# Patient Record
Sex: Female | Born: 1935 | Race: White | Hispanic: No | Smoking: Former smoker
Health system: Southern US, Community
[De-identification: ages and names within clinical notes are randomized; demographics above are authoritative.]

## PROBLEM LIST (undated history)

## (undated) DIAGNOSIS — I10 Essential (primary) hypertension: Secondary | ICD-10-CM

## (undated) DIAGNOSIS — E119 Type 2 diabetes mellitus without complications: Secondary | ICD-10-CM

## (undated) DIAGNOSIS — E079 Disorder of thyroid, unspecified: Secondary | ICD-10-CM

## (undated) DIAGNOSIS — M1990 Unspecified osteoarthritis, unspecified site: Secondary | ICD-10-CM

## (undated) DIAGNOSIS — G459 Transient cerebral ischemic attack, unspecified: Secondary | ICD-10-CM

## (undated) HISTORY — PX: EYE SURGERY: SHX253

---

## 2017-08-12 ENCOUNTER — Emergency Department
Admission: EM | Admit: 2017-08-12 | Discharge: 2017-08-12 | Disposition: A | Discharge status: Home / Self Care | Payer: Medicare Other | Attending: Emergency Medicine | Admitting: Emergency Medicine

## 2017-08-12 DIAGNOSIS — R410 Disorientation, unspecified: Secondary | ICD-10-CM | POA: Diagnosis present

## 2017-08-12 DIAGNOSIS — Z8673 Personal history of transient ischemic attack (TIA), and cerebral infarction without residual deficits: Secondary | ICD-10-CM | POA: Diagnosis not present

## 2017-08-12 DIAGNOSIS — I10 Essential (primary) hypertension: Secondary | ICD-10-CM | POA: Diagnosis not present

## 2017-08-12 LAB — COMPREHENSIVE METABOLIC PANEL
ALT: 12 U/L (ref 0–44)
AST: 17 U/L (ref 15–41)
Albumin: 3.6 g/dL (ref 3.5–5.0)
Alkaline Phosphatase: 95 U/L (ref 38–126)
Anion gap: 8 (ref 5–15)
BUN: 23 mg/dL (ref 8–23)
CHLORIDE: 102 mmol/L (ref 98–111)
CO2: 26 mmol/L (ref 22–32)
Calcium: 9 mg/dL (ref 8.9–10.3)
Creatinine, Ser: 1.03 mg/dL — ABNORMAL HIGH (ref 0.44–1.00)
GFR calc Af Amer: 54 mL/min — ABNORMAL LOW (ref >60)
GFR, EST NON AFRICAN AMERICAN: 47 mL/min — AB (ref >60)
Glucose, Bld: 104 mg/dL — ABNORMAL HIGH (ref 70–99)
POTASSIUM: 4.9 mmol/L (ref 3.5–5.1)
Sodium: 136 mmol/L (ref 135–145)
Total Bilirubin: 0.4 mg/dL (ref 0.3–1.2)
Total Protein: 7.5 g/dL (ref 6.5–8.1)

## 2017-08-12 LAB — URINALYSIS, ROUTINE W REFLEX MICROSCOPIC
Bilirubin Urine: NEGATIVE
GLUCOSE, UA: NEGATIVE mg/dL
Hgb urine dipstick: NEGATIVE
Ketones, ur: NEGATIVE mg/dL
LEUKOCYTES UA: NEGATIVE
Nitrite: NEGATIVE
PH: 6 (ref 5.0–8.0)
Protein, ur: NEGATIVE mg/dL
SPECIFIC GRAVITY, URINE: 1.016 (ref 1.005–1.030)

## 2017-08-12 LAB — CBC
HEMATOCRIT: 29.7 % — AB (ref 35.0–47.0)
Hemoglobin: 9.9 g/dL — ABNORMAL LOW (ref 12.0–16.0)
MCH: 27.9 pg (ref 26.0–34.0)
MCHC: 33.1 g/dL (ref 32.0–36.0)
MCV: 84.1 fL (ref 80.0–100.0)
Platelets: 316 10*3/uL (ref 150–440)
RBC: 3.53 MIL/uL — AB (ref 3.80–5.20)
RDW: 15.1 % — ABNORMAL HIGH (ref 11.5–14.5)
WBC: 8.4 10*3/uL (ref 3.6–11.0)

## 2017-08-12 NOTE — ED Notes (Signed)
Warm blankets provided. Family at bedside. Pt alert. Blood pressure cuff placed on arm, pt on cardiac monitor.

## 2017-08-12 NOTE — ED Notes (Signed)
Family requesting to speak with md. md notified.

## 2017-08-12 NOTE — ED Provider Notes (Signed)
[facility] Emergency Department Provider Note  Time seen: 7:55 PM  I have reviewed the triage vital signs and the nursing notes.   HISTORY  Chief Complaint Altered Mental Status    HPI Jessica Morse is a 84 y.o. female with a past medical history of hypertension, hyperlipidemia, possible seizure disorder now on Keppra, prior TIA on clopidogrel, presents to the emergency department for confusion.  According to the daughters for the past 1 week the patient has been intermittently confused.  They noticed that the patient was supposed to be taking tramadol as needed but for the past at least 1 week the patient has been taking it twice daily on a scheduled basis.  They state they discontinued use of this medication today, last night was her last dose.  They stated that the patient appeared to be better today was not as confused however this afternoon she once again became confused so they sent her to the emergency department for evaluation from her nursing facility.  Here the patient is awake alert she is oriented x4 and she has no complaints.  Daughters are here with the patient who states the patient is now back to her baseline.  State the patient has been seen by neurology they initially but the patient on Keppra 500 mg twice daily but decrease this to 250 mg due to sedation.  No obvious seizure or at least tonic-clonic seizure today per daughter's per report.  No infectious symptoms per daughter.  Largely negative review of systems.   History reviewed. No pertinent past medical history.  There are no active problems to display for this patient.   History reviewed. No pertinent surgical history.  Prior to Admission medications   Not on File    Allergies  Allergen Reactions  . Clonidine Derivatives   . Codeine   . Iodinated Diagnostic Agents   . Propoxyphene     History reviewed. No pertinent family history.  Social History Social History   Tobacco Use   . Smoking status: Never Smoker  . Smokeless tobacco: Never Used  Substance Use Topics  . Alcohol use: Never    Frequency: Never  . Drug use: Never    Review of Systems Constitutional: Negative for fever. Cardiovascular: Negative for chest pain. Respiratory: Negative for shortness of breath. Gastrointestinal: Negative for abdominal pain, vomiting and diarrhea. Genitourinary: Negative for urinary compaints Musculoskeletal: Negative for musculoskeletal complaints Skin: Negative for skin complaints  Neurological: Negative for headache All other ROS negative  ____________________________________________   PHYSICAL EXAM:  VITAL SIGNS: ED Triage Vitals  Enc Vitals Group     BP --      Pulse Rate 08/12/17 1915 66     Resp 08/12/17 1915 18     Temp --      Temp src --      SpO2 08/12/17 1915 100 %     Weight 08/12/17 1817 180 lb (81.6 kg)     Height 08/12/17 1817 5\' 5"  (1.651 m)     Head Circumference --      Peak Flow --      Pain Score 08/12/17 1816 0     Pain Loc --      Pain Edu? --      Excl. in GC? --    Constitutional: Alert and oriented. Well appearing and in no distress. Eyes: Normal exam ENT   Head: Normocephalic and atraumatic.   Mouth/Throat: Mucous membranes are moist. Cardiovascular: Normal rate, regular rhythm Respiratory: Normal  respiratory effort without tachypnea nor retractions. Breath sounds are clear  Gastrointestinal: Soft and nontender. No distention. Musculoskeletal: Nontender with normal range of motion in all extremities.  Neurologic:  Normal speech and language. No gross focal neurologic deficits  Skin:  Skin is warm, dry and intact.  Psychiatric: Mood and affect are normal.   ____________________________________________    EKG  EKG reviewed and interpreted by myself shows what appears to be a sinus rhythm at 68 bpm with a narrow QRS, normal axis, normal intervals nonspecific ST changes.  Electrical interference.   INITIAL  IMPRESSION / ASSESSMENT AND PLAN / ED COURSE  Pertinent labs & imaging results that were available during my care of the patient were reviewed by me and considered in my medical decision making (see chart for details).  Patient presents to the emergency department for altered mental status, now resolved.  Differential would include medication reaction, TIA, partial or absence seizure, infectious etiology, metabolic abnormality.  Overall the patient appears very well she is currently alert and oriented x4 she is hard of hearing but is otherwise normal physical examination.  Patient's labs show mild anemia otherwise negative.  Urinalysis is normal.  I discussed with the patient's daughters were now here with the patient he states the patient has been taking tramadol and they believe this could be the cause of her intermittent altered mental status, this very possibly could be the cause.  They state the patient has seen [facility] neurology.  Has a [facility] primary care physician.  Has good outpatient follow-up.  As the patient appears very well currently with normal work-up they are comfortable taking the patient home and following up with her primary care doctor.  I believe this to be a reasonable and rational plan of care given the patient's current presentation.  ____________________________________________   FINAL CLINICAL IMPRESSION(S) / ED DIAGNOSES  Confusion, resolved    Minna AntisPaduchowski, Brandonn Capelli, MD 08/12/17 [phone]

## 2017-08-12 NOTE — ED Triage Notes (Signed)
[facility] c/o AMS. Pt is oriented x4. Protocol initiated.

## 2017-09-16 ENCOUNTER — Emergency Department: Payer: Medicare Other

## 2017-09-16 ENCOUNTER — Emergency Department
Admission: EM | Admit: 2017-09-16 | Discharge: 2017-09-16 | Disposition: A | Discharge status: Skilled Nursing Facility | Payer: Medicare Other | Attending: Emergency Medicine | Admitting: Emergency Medicine

## 2017-09-16 DIAGNOSIS — I10 Essential (primary) hypertension: Secondary | ICD-10-CM | POA: Diagnosis not present

## 2017-09-16 DIAGNOSIS — E119 Type 2 diabetes mellitus without complications: Secondary | ICD-10-CM | POA: Diagnosis not present

## 2017-09-16 DIAGNOSIS — W1789XA Other fall from one level to another, initial encounter: Secondary | ICD-10-CM | POA: Insufficient documentation

## 2017-09-16 DIAGNOSIS — S01112A Laceration without foreign body of left eyelid and periocular area, initial encounter: Secondary | ICD-10-CM | POA: Diagnosis not present

## 2017-09-16 DIAGNOSIS — S0990XA Unspecified injury of head, initial encounter: Secondary | ICD-10-CM

## 2017-09-16 DIAGNOSIS — Y939 Activity, unspecified: Secondary | ICD-10-CM | POA: Diagnosis not present

## 2017-09-16 DIAGNOSIS — Y999 Unspecified external cause status: Secondary | ICD-10-CM | POA: Insufficient documentation

## 2017-09-16 DIAGNOSIS — W19XXXA Unspecified fall, initial encounter: Secondary | ICD-10-CM

## 2017-09-16 DIAGNOSIS — Y92129 Unspecified place in nursing home as the place of occurrence of the external cause: Secondary | ICD-10-CM | POA: Insufficient documentation

## 2017-09-16 NOTE — ED Notes (Signed)
EDP at bedside cleaned laceration and placed derma bound on the laceration. VSS

## 2017-09-16 NOTE — ED Triage Notes (Signed)
Pt presents today [facility] for [facility]. Pt [facility] and [facility] off the edge. Pt hit head on table leg and got a small lac to the Left eyebrow. EDP at bedside.

## 2017-09-16 NOTE — ED Provider Notes (Signed)
[facility] Emergency Department Provider Note  Time seen: 12:46 PM  I have reviewed the triage vital signs and the nursing notes.   HISTORY  Chief Complaint Fall    HPI Jessica Morse is a 84 y.o. female with a past medical history of macular degeneration, diabetes, hypertension, presents to the emergency department after a fall.  According to EMS report patient was at her nursing facility when she fell to the ground hitting her head on the floor.  Patient also landed on her left hand.  Patient has small laceration to her left eyebrow, hemostatic upon EMS arrival.  Patient complaining of pain to the left hand.  Denies any headache.  Is not sure if she lost consciousness.  No past medical history on file.  There are no active problems to display for this patient.   No past surgical history on file.  Prior to Admission medications   Not on File    Allergies  Allergen Reactions  . Clonidine Derivatives   . Codeine   . Iodinated Diagnostic Agents   . Propoxyphene     No family history on file.  Social History Social History   Tobacco Use  . Smoking status: Never Smoker  . Smokeless tobacco: Never Used  Substance Use Topics  . Alcohol use: Never    Frequency: Never  . Drug use: Never    Review of Systems Constitutional: Unknown LOC. Eyes: Negative for visual complaints, recent cataract surgery. Cardiovascular: Negative for chest pain. Respiratory: Negative for shortness of breath. Gastrointestinal: Negative for abdominal pain Musculoskeletal: Mild to moderate left hand pain Skin: Small laceration left eyebrow, hemostatic. Neurological: Negative for headache All other ROS negative  ____________________________________________   PHYSICAL EXAM:  VITAL SIGNS: ED Triage Vitals [09/16/17 1245]  Enc Vitals Group     BP      Pulse      Resp      Temp      Temp src      SpO2      Weight 171 lb (77.6 kg)     Height 5\' 6"  (1.676 m)      Head Circumference      Peak Flow      Pain Score 0     Pain Loc      Pain Edu?      Excl. in GC?    Constitutional: Alert and oriented. Well appearing and in no distress. Eyes: Normal exam ENT   Head: Small hematoma overlying left eyebrow with very small laceration approximately 1.5 cm, hemostatic and non-gaping.   Mouth/Throat: Mucous membranes are moist. Cardiovascular: Normal rate, regular rhythm.  2/6 systolic ejection murmur. Respiratory: Normal respiratory effort without tachypnea nor retractions. Breath sounds are clear Gastrointestinal: Soft and nontender. No distention. Musculoskeletal: Mild ecchymosis to dorsal aspect of left hand with mild to moderate tenderness to palpation over this area. Neurologic:  Normal speech and language. No gross focal neurologic deficits  Skin:  Skin is warm, dry and intact.  Psychiatric: Mood and affect are normal.   ____________________________________________   RADIOLOGY  CT scan of the head and neck are negative. Left hand x-ray negative  ____________________________________________   INITIAL IMPRESSION / ASSESSMENT AND PLAN / ED COURSE  Pertinent labs & imaging results that were available during my care of the patient were reviewed by me and considered in my medical decision making (see chart for details).  Patient presents emergency department for a fall.  Patient had a small laceration left  eyebrow, hemostatic which was cleaned by myself and Dermabond applied to ensure hemostasis continues.  Will obtain CT imaging of the head to help rule out ICH, will obtain a left hand x-ray to rule out fracture.  Imaging is negative.  Patient continues to appear very well in the emergency department.  We will discharge.  Family here with patient and are both agreeable to plan of care.  ____________________________________________   FINAL CLINICAL IMPRESSION(S) / ED DIAGNOSES  Fall Laceration    Minna AntisPaduchowski, Rosendo Couser,  MD 09/16/17 1436

## 2017-09-16 NOTE — ED Notes (Signed)
Pt was given drink, crackers and PB at this time. Family is at bedside. Pt is awaiting CT at this time.

## 2017-10-05 ENCOUNTER — Other Ambulatory Visit: Payer: Self-pay | Admitting: Radiology

## 2019-04-09 IMAGING — CT CT CERVICAL SPINE W/O CM
3 of 5 series · 14 of 33 positions shown, 16 images · non-contrast
Comparison: None.

CLINICAL DATA: Fell and hit head today.

EXAM:
CT HEAD WITHOUT CONTRAST
CT CERVICAL SPINE WITHOUT CONTRAST
TECHNIQUE: Multidetector CT imaging of the head and cervical spine was
performed following the standard protocol without intravenous
contrast. Multiplanar CT image reconstructions of the cervical spine
were also generated.

[Series 5: c spine soft · axial · 0.26mm/px · z∈[-200,-102]mm · 6 of 64 slices shown, 8 images]
[im 8/64  soft-tissue]
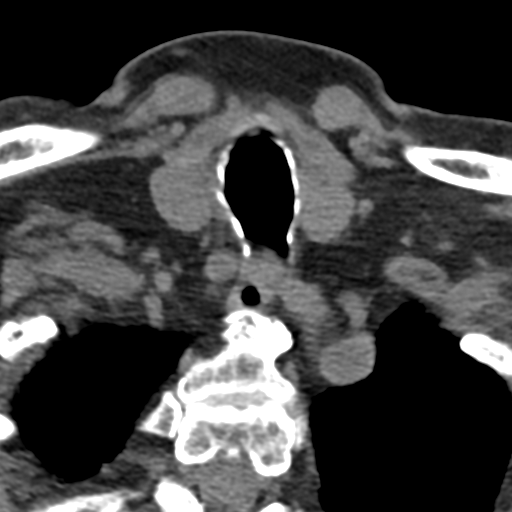
[im 8/64  bone]
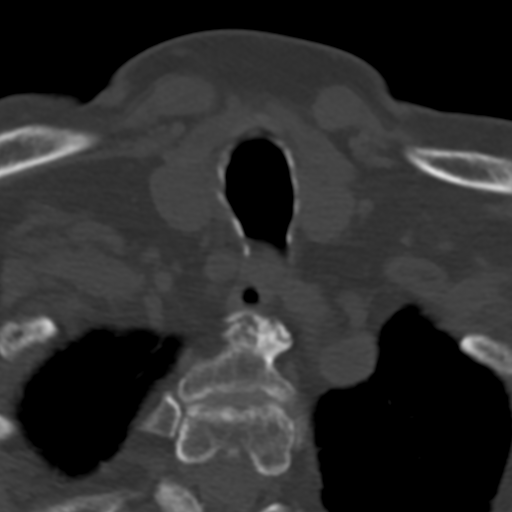
[im 22/64  bone]
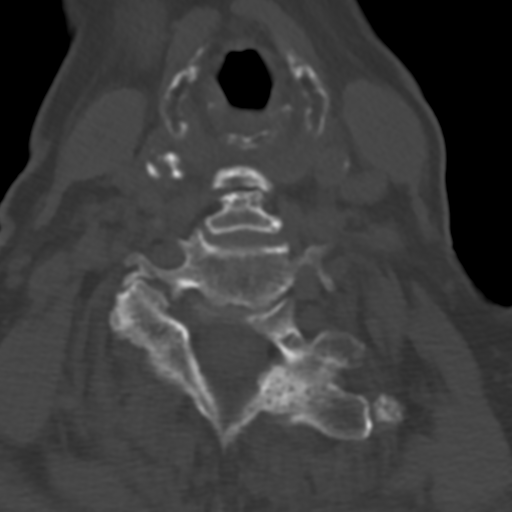
[im 29/64  bone]
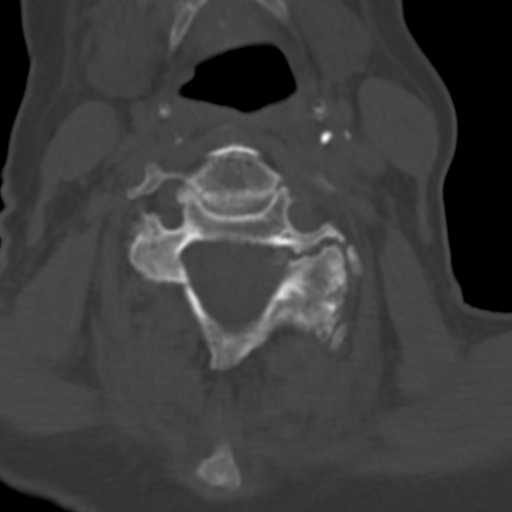
[im 36/64  bone]
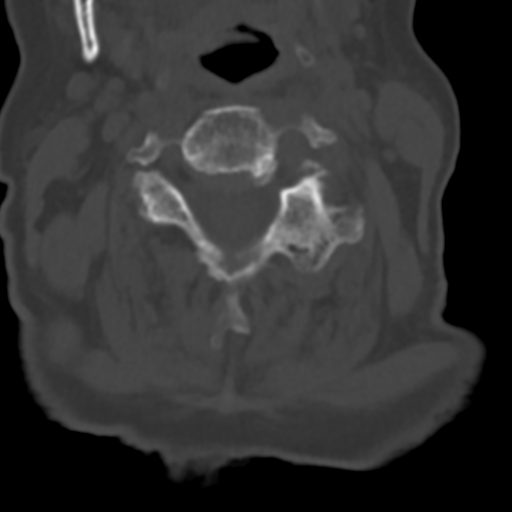
[im 50/64  soft-tissue]
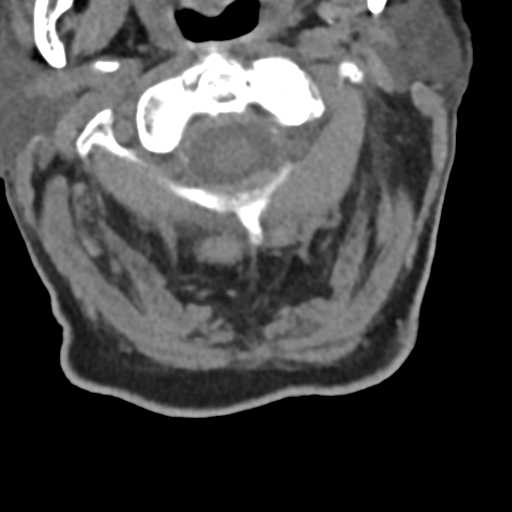
[im 50/64  bone]
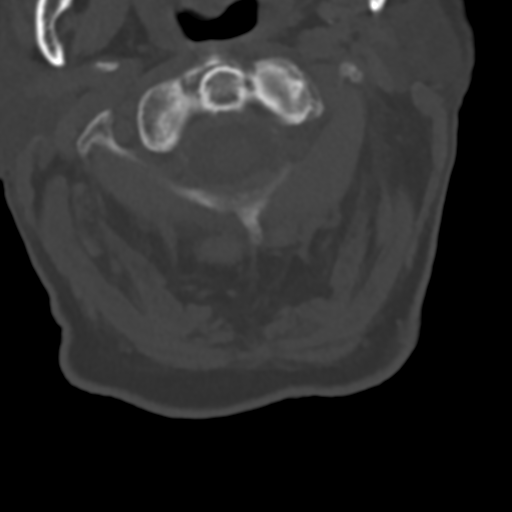
[im 57/64  bone]
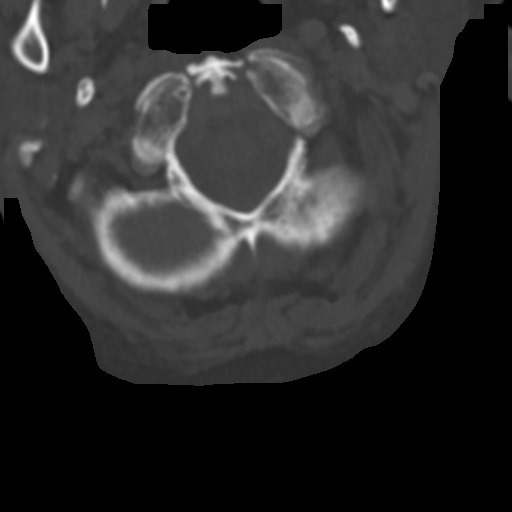

[Series 8: coronal soft tissue · coronal · 0.30mm/px · 3 of 61 slices shown]
[im 22/61  bone]
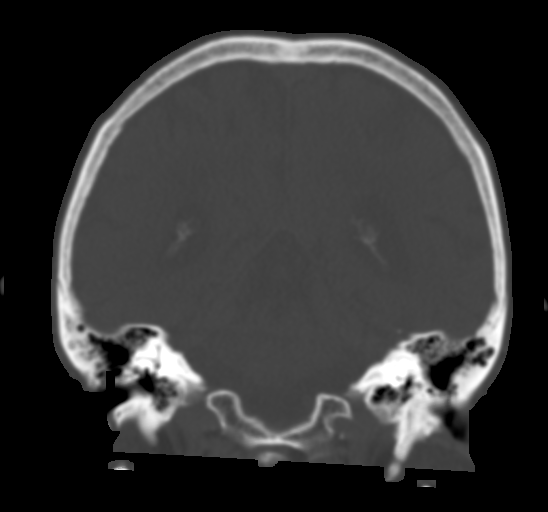
[im 28/61  bone]
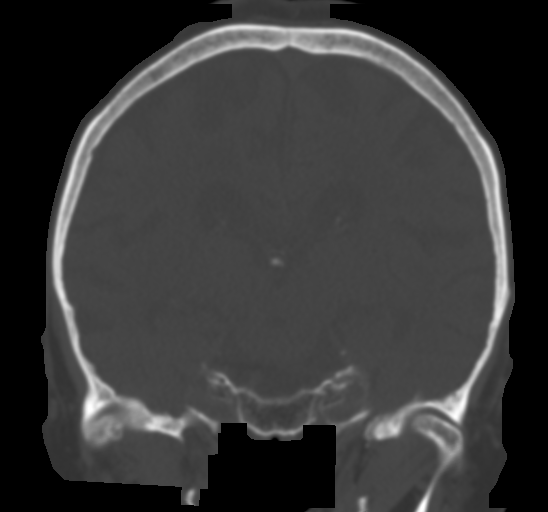
[im 33/61  bone]
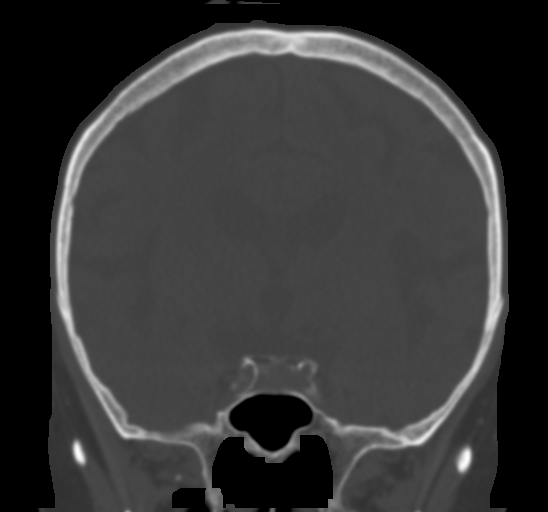

[Series 10: sagittal bone · sagittal · 0.23mm/px · 5 of 58 slices shown]
[im 10/58  bone]
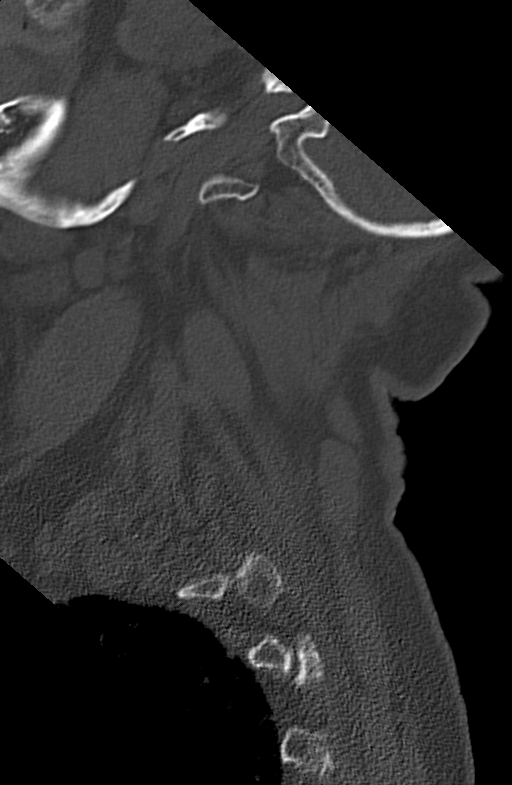
[im 20/58  bone]
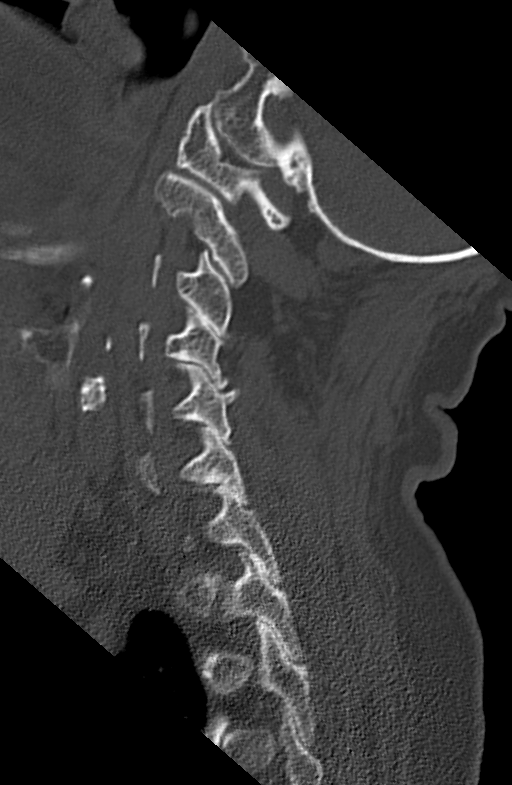
[im 29/58  bone]
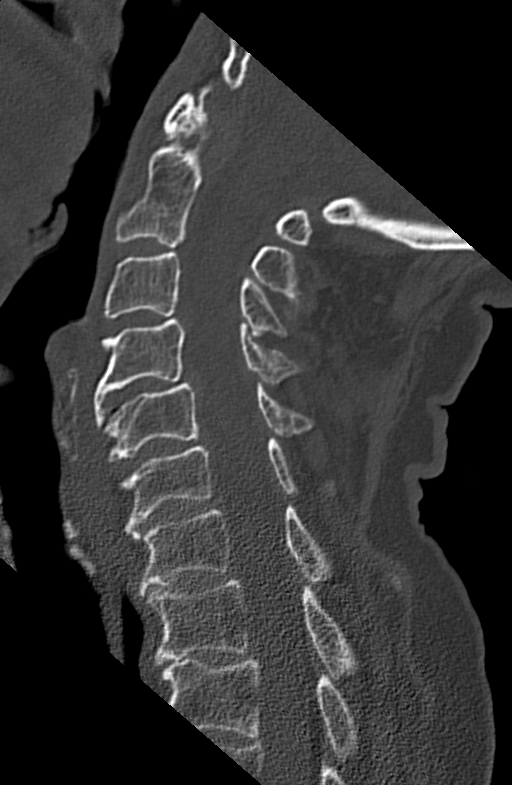
[im 39/58  bone]
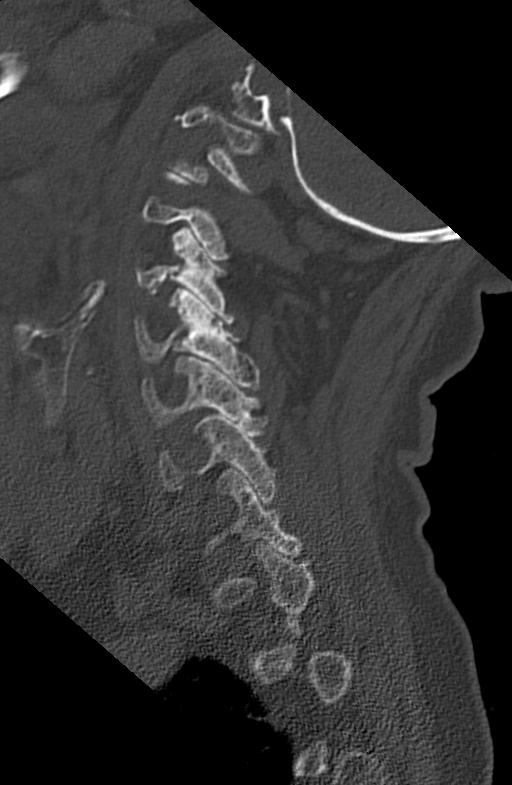
[im 48/58  bone]
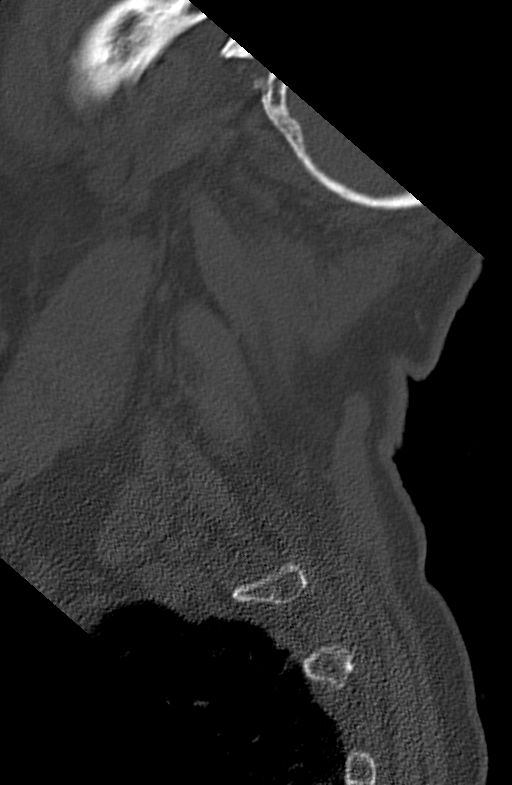

[14 of 33 positions shown; findings below may reference images not displayed]

FINDINGS: CT HEAD FINDINGS

Brain: Age related cerebral atrophy, ventriculomegaly and
periventricular white matter disease. No extra-axial fluid
collections are identified. No CT findings for acute hemispheric
infarction or intracranial hemorrhage. No mass lesions. There is a
small peripheral lesion in the left parietal area which contains fat
and is likely small lipoma or dermoid.

Vascular: No hyperdense vessel or unexpected calcification.
Atherosclerotic calcifications are noted.

Skull: No acute skull fractures identified.  No bone lesions.

Sinuses/Orbits: The paranasal sinuses and mastoid air cells are
clear. The globes are intact.

Other: There is a subcutaneous hematoma involving the left orbital
and supraorbital region.

CT CERVICAL SPINE FINDINGS

Alignment: Degenerative cervical spondylosis with multilevel disc
disease and facet disease.

Skull base and vertebrae: No acute fracture. No primary bone lesion
or focal pathologic process.

Soft tissues and spinal canal: No prevertebral fluid or swelling. No
visible canal hematoma.

Disc levels: Very generous spinal canal. No canal stenosis or large
disc protrusions. There is multilevel foraminal stenosis due to
severe facet disease and uncinate spurring.

Upper chest: The lung apices are grossly clear.

Other: No neck mass or adenopathy. Bilateral carotid artery
calcifications noted.
IMPRESSION: 1. No acute intracranial findings or acute skull fracture.
2. Left orbital and supraorbital scalp hematoma without underlying
fracture.
3. Degenerative cervical spondylosis with multilevel disc disease
and facet disease but no acute cervical spine fracture or canal
compromise.

## 2021-03-31 ENCOUNTER — Non-Acute Institutional Stay: Payer: Medicare Other | Admitting: Student

## 2021-03-31 DIAGNOSIS — R600 Localized edema: Secondary | ICD-10-CM

## 2021-03-31 DIAGNOSIS — F320 Major depressive disorder, single episode, mild: Secondary | ICD-10-CM

## 2021-03-31 DIAGNOSIS — Z515 Encounter for palliative care: Secondary | ICD-10-CM

## 2021-03-31 DIAGNOSIS — F0280 Dementia in other diseases classified elsewhere without behavioral disturbance: Secondary | ICD-10-CM

## 2021-03-31 NOTE — Progress Notes (Signed)
? ? ?[facility] ?Community Palliative Care Consult Note ?Telephone: [phone]  ?Fax: [phone]  ? ?Date of encounter: 03/31/21 ?12:50 PM ?PATIENT NAME: Jessica Morse ?[place] [address]   ?[phone] (home)  ?DOB: 03-04-1926 ?MRN: [id number] ?PRIMARY CARE PROVIDER:    ?Dr. Peggyann Juba  ? ?REFERRING PROVIDER:   ?Dr. Peggyann Juba  ? ?RESPONSIBLE PARTY:    ?Contact Information   ? ? Name Relation Home Work Mobile  ? Jessica Morse Daughter [phone]    ? ?  ? ? ? ?I met face to face with patient in the facility. Palliative Care was asked to follow this patient by consultation request of  Dr. Peggyann Juba to address advance care planning and complex medical decision making. This is the initial visit.  ? ?Spoke with daughter Jessica Morse via telephone.  ? ? ?                                 ASSESSMENT AND PLAN / RECOMMENDATIONS:  ? ?Advance Care Planning/Goals of Care: Goals include to maximize quality of life and symptom management. Patient/health care surrogate gave his/her permission to discuss.Our advance care planning conversation included a discussion about:    ?The value and importance of advance care planning  ?Experiences with loved ones who have been seriously ill or have died  ?Exploration of personal, cultural or spiritual beliefs that might influence medical decisions  ?Exploration of goals of care in the event of a sudden injury or illness  ?CODE STATUS: DNR ? ?Family would like for patient to be comfortable.  Education provided on Palliative Medicine vs. Hospice services. Will continue to provide supportive care, symptom management. Will refer for hospice when she meets criteria.  ? ? ?Symptom Management/Plan: ? ?Mixed Alzheimer's and vascular dementia- continue to reorient and direct as needed. Staff to assist with adl's. Monitor for adjustment to new facility, monitor for worsening behaviors, hallucinations or delusions. Use walker for ambulation; monitor for  falls/safety. Continue trazodone as directed.  ? ?Depression-continue citalopram 20 mg daily; monitor for worsening depression/adjustment to facility.  ? ?LE edema-continue compression hose daily; elevate legs when in bed or chair.  ? ?Follow up Palliative Care Visit: Palliative care will continue to follow for complex medical decision making, advance care planning, and clarification of goals. Return in 4 weeks or prn. ? ? ?This visit was coded based on medical decision making (MDM). ? ?PPS: 40% ? ?HOSPICE ELIGIBILITY/DIAGNOSIS: TBD ? ?Chief Complaint: Palliative Medicine initial consult.  ? ?HISTORY OF PRESENT ILLNESS:  Jessica Morse is a 88 y.o. year old female  with mixed alzheimer's and vascular dementia, anxiety, depression, TIA, tremor, T2DM, seizure disorder, hx of chronic osteomyelitis on chronic antibiotic suppression.   ? ?Patient recently moved to [place]. She is on dementia unit. She is adjusting per staff, but does become tearful when her family visits. She is able to be redirected. Staff is attempting to engage her more, bring out to common area. She is eating well. Denies pain, shortness of breath, nausea, constipation. Uses walker for ambulation; no recent falls. Hx of right lower extremity DVT; her Eliquis was stopped during most recent hospitalization.  ? ?Patient received in her room; she came out to common area. She does answer questions; she is hard of hearing. Pleasant and cooperative. HPI and ROS primarily obtained per facility staff and daughter.  ? ?History obtained from review of EMR, discussion  with primary team, and interview with family, facility staff/caregiver and/or Jessica Morse.  ?I reviewed available labs, medications, imaging, studies and related documents from the EMR.  Records reviewed and summarized above.  ? ? ?Physical Exam: ?Pulse 72, resp 16, sats 99% on room air ?Constitutional: NAD ?General: frail appearing, ?EYES: anicteric sclera, lids intact, no  discharge  ?ENMT: hard of hearing, oral mucous membranes moist ?CV: S1S2, RRR, 1+ LE edema ?Pulmonary: LCTA, no increased work of breathing, no cough, room air ?Abdomen:  normo-active BS + 4 quadrants, soft and non tender, no ascites ?GU: deferred ?MSK: no sarcopenia, moves all extremities, ambulatory ?Skin: warm and dry, no rashes or wounds on visible skin ?Neuro: + generalized weakness,  A & O to person ?Psych: non-anxious affect ?Hem/lymph/immuno: no widespread bruising ?CURRENT PROBLEM LIST: There are no problems to display for this patient. ? ?PAST MEDICAL HISTORY:  ?Active Ambulatory Problems  ?  Diagnosis Date Noted  ? No Active Ambulatory Problems  ? ?Resolved Ambulatory Problems  ?  Diagnosis Date Noted  ? No Resolved Ambulatory Problems  ? ?No Additional Past Medical History  ? ?SOCIAL HX:  ?Social History  ? ?Tobacco Use  ? Smoking status: Never  ? Smokeless tobacco: Never  ?Substance Use Topics  ? Alcohol use: Never  ? ?FAMILY HX: No family history on file.   ? ?ALLERGIES:  ?Allergies  ?Allergen Reactions  ? Quinolones Anaphylaxis  ?  Family history of reaction  ? Clonazepam Other (See Comments)  ? Clonidine Derivatives   ? Codeine   ? Iodinated Contrast Media   ? Propoxyphene   ?   ?PERTINENT MEDICATIONS:  ?Outpatient Encounter Medications as of 03/31/2021  ?Medication Sig  ? Artificial Saliva (BIOTENE DRY MOUTH MOISTURIZING) SOLN Take 1 spray by mouth 3 (three) times daily before meals.  ? cephALEXin (KEFLEX) 500 MG capsule Take 500 mg by mouth 2 (two) times daily.  ? citalopram (CELEXA) 20 MG tablet Take 20 mg by mouth daily.  ? glipiZIDE (GLUCOTROL) 5 MG tablet Take 2.5 mg by mouth every morning.  ? levETIRAcetam (KEPPRA) 250 MG tablet Take 250-500 mg by mouth 2 (two) times daily. Take 250 by mouth in the morning and 500 mg by mouth at bedtime.  ? levothyroxine (SYNTHROID, LEVOTHROID) 137 MCG tablet Take 137 mcg by mouth daily before breakfast.  ? lisinopril (PRINIVIL,ZESTRIL) 20 MG tablet Take 20 mg  by mouth daily.  ? Melatonin 3 MG TABS Take 3 mg by mouth at bedtime.  ? metFORMIN (GLUCOPHAGE) 1000 MG tablet Take 1,000 mg by mouth at bedtime.  ? multivitamin-lutein (OCUVITE-LUTEIN) CAPS capsule Take 1 capsule by mouth daily.  ? prednisoLONE acetate (PRED FORTE) 1 % ophthalmic suspension Place 1 drop into the right eye 4 (four) times daily.  ? propranolol (INDERAL) 20 MG tablet Take 20 mg by mouth 2 (two) times daily.  ? ?No facility-administered encounter medications on file as of 03/31/2021.  ? ?Thank you for the opportunity to participate in the care of Ms. Dlugosz.  The palliative care team will continue to follow. Please call our office at [phone] if we can be of additional assistance.  ? ?Ezekiel Slocumb, NP  ? ?COVID-19 PATIENT SCREENING TOOL ?Asked and negative response unless otherwise noted: ? ?Have you had symptoms of covid, tested positive or been in contact with someone with symptoms/positive test in the past 5-10 days? No ? ?

## 2021-04-22 ENCOUNTER — Emergency Department: Payer: Medicare Other

## 2021-04-22 ENCOUNTER — Encounter: Payer: Self-pay | Admitting: Intensive Care

## 2021-04-22 ENCOUNTER — Inpatient Hospital Stay
Admission: EM | Admit: 2021-04-22 | Discharge: 2021-04-27 | DRG: 871 | Disposition: A | Discharge status: Hospice | Payer: Medicare Other | Source: Skilled Nursing Facility | Attending: Internal Medicine | Admitting: Internal Medicine

## 2021-04-22 ENCOUNTER — Other Ambulatory Visit: Payer: Self-pay

## 2021-04-22 DIAGNOSIS — E119 Type 2 diabetes mellitus without complications: Secondary | ICD-10-CM | POA: Diagnosis present

## 2021-04-22 DIAGNOSIS — R627 Adult failure to thrive: Secondary | ICD-10-CM | POA: Diagnosis present

## 2021-04-22 DIAGNOSIS — F419 Anxiety disorder, unspecified: Secondary | ICD-10-CM | POA: Diagnosis present

## 2021-04-22 DIAGNOSIS — A419 Sepsis, unspecified organism: Principal | ICD-10-CM | POA: Diagnosis present

## 2021-04-22 DIAGNOSIS — G40909 Epilepsy, unspecified, not intractable, without status epilepticus: Secondary | ICD-10-CM | POA: Diagnosis present

## 2021-04-22 DIAGNOSIS — R54 Age-related physical debility: Secondary | ICD-10-CM | POA: Diagnosis present

## 2021-04-22 DIAGNOSIS — Z7984 Long term (current) use of oral hypoglycemic drugs: Secondary | ICD-10-CM

## 2021-04-22 DIAGNOSIS — Z87898 Personal history of other specified conditions: Secondary | ICD-10-CM

## 2021-04-22 DIAGNOSIS — G4700 Insomnia, unspecified: Secondary | ICD-10-CM | POA: Diagnosis present

## 2021-04-22 DIAGNOSIS — F0390 Unspecified dementia without behavioral disturbance: Secondary | ICD-10-CM

## 2021-04-22 DIAGNOSIS — X58XXXA Exposure to other specified factors, initial encounter: Secondary | ICD-10-CM | POA: Diagnosis present

## 2021-04-22 DIAGNOSIS — J189 Pneumonia, unspecified organism: Secondary | ICD-10-CM | POA: Diagnosis not present

## 2021-04-22 DIAGNOSIS — Z79890 Hormone replacement therapy (postmenopausal): Secondary | ICD-10-CM

## 2021-04-22 DIAGNOSIS — Z20822 Contact with and (suspected) exposure to covid-19: Secondary | ICD-10-CM | POA: Diagnosis present

## 2021-04-22 DIAGNOSIS — Z66 Do not resuscitate: Secondary | ICD-10-CM | POA: Diagnosis present

## 2021-04-22 DIAGNOSIS — K449 Diaphragmatic hernia without obstruction or gangrene: Secondary | ICD-10-CM | POA: Diagnosis present

## 2021-04-22 DIAGNOSIS — I10 Essential (primary) hypertension: Secondary | ICD-10-CM | POA: Diagnosis present

## 2021-04-22 DIAGNOSIS — F03911 Unspecified dementia, unspecified severity, with agitation: Secondary | ICD-10-CM | POA: Diagnosis present

## 2021-04-22 DIAGNOSIS — Z91041 Radiographic dye allergy status: Secondary | ICD-10-CM

## 2021-04-22 DIAGNOSIS — Z87891 Personal history of nicotine dependence: Secondary | ICD-10-CM

## 2021-04-22 DIAGNOSIS — Z881 Allergy status to other antibiotic agents status: Secondary | ICD-10-CM

## 2021-04-22 DIAGNOSIS — Z515 Encounter for palliative care: Secondary | ICD-10-CM

## 2021-04-22 DIAGNOSIS — S42202A Unspecified fracture of upper end of left humerus, initial encounter for closed fracture: Secondary | ICD-10-CM | POA: Diagnosis present

## 2021-04-22 DIAGNOSIS — Z885 Allergy status to narcotic agent status: Secondary | ICD-10-CM

## 2021-04-22 DIAGNOSIS — Z888 Allergy status to other drugs, medicaments and biological substances status: Secondary | ICD-10-CM

## 2021-04-22 DIAGNOSIS — R531 Weakness: Secondary | ICD-10-CM

## 2021-04-22 DIAGNOSIS — F0392 Unspecified dementia, unspecified severity, with psychotic disturbance: Secondary | ICD-10-CM | POA: Diagnosis present

## 2021-04-22 DIAGNOSIS — I700 Atherosclerosis of aorta: Secondary | ICD-10-CM | POA: Diagnosis present

## 2021-04-22 DIAGNOSIS — F0394 Unspecified dementia, unspecified severity, with anxiety: Secondary | ICD-10-CM | POA: Diagnosis present

## 2021-04-22 DIAGNOSIS — E039 Hypothyroidism, unspecified: Secondary | ICD-10-CM | POA: Diagnosis present

## 2021-04-22 DIAGNOSIS — I2510 Atherosclerotic heart disease of native coronary artery without angina pectoris: Secondary | ICD-10-CM | POA: Diagnosis present

## 2021-04-22 DIAGNOSIS — G9341 Metabolic encephalopathy: Secondary | ICD-10-CM | POA: Diagnosis present

## 2021-04-22 DIAGNOSIS — Z8673 Personal history of transient ischemic attack (TIA), and cerebral infarction without residual deficits: Secondary | ICD-10-CM

## 2021-04-22 DIAGNOSIS — M1990 Unspecified osteoarthritis, unspecified site: Secondary | ICD-10-CM | POA: Diagnosis present

## 2021-04-22 DIAGNOSIS — F0393 Unspecified dementia, unspecified severity, with mood disturbance: Secondary | ICD-10-CM | POA: Diagnosis present

## 2021-04-22 DIAGNOSIS — Z79899 Other long term (current) drug therapy: Secondary | ICD-10-CM

## 2021-04-22 HISTORY — DX: Unspecified osteoarthritis, unspecified site: M19.90

## 2021-04-22 HISTORY — DX: Transient cerebral ischemic attack, unspecified: G45.9

## 2021-04-22 HISTORY — DX: Disorder of thyroid, unspecified: E07.9

## 2021-04-22 HISTORY — DX: Essential (primary) hypertension: I10

## 2021-04-22 HISTORY — DX: Type 2 diabetes mellitus without complications: E11.9

## 2021-04-22 LAB — CBC
HCT: 36.2 % (ref 36.0–46.0)
Hemoglobin: 11.8 g/dL — ABNORMAL LOW (ref 12.0–15.0)
MCH: 30.2 pg (ref 26.0–34.0)
MCHC: 32.6 g/dL (ref 30.0–36.0)
MCV: 92.6 fL (ref 80.0–100.0)
Platelets: 200 10*3/uL (ref 150–400)
RBC: 3.91 MIL/uL (ref 3.87–5.11)
RDW: 12.2 % (ref 11.5–15.5)
WBC: 12.7 10*3/uL — ABNORMAL HIGH (ref 4.0–10.5)
nRBC: 0 % (ref 0.0–0.2)

## 2021-04-22 LAB — BASIC METABOLIC PANEL
Anion gap: 9 (ref 5–15)
BUN: 20 mg/dL (ref 8–23)
CO2: 27 mmol/L (ref 22–32)
Calcium: 8.7 mg/dL — ABNORMAL LOW (ref 8.9–10.3)
Chloride: 98 mmol/L (ref 98–111)
Creatinine, Ser: 1.1 mg/dL — ABNORMAL HIGH (ref 0.44–1.00)
GFR, Estimated: 47 mL/min — ABNORMAL LOW (ref >60)
Glucose, Bld: 244 mg/dL — ABNORMAL HIGH (ref 70–99)
Potassium: 4 mmol/L (ref 3.5–5.1)
Sodium: 134 mmol/L — ABNORMAL LOW (ref 135–145)

## 2021-04-22 LAB — URINALYSIS, ROUTINE W REFLEX MICROSCOPIC
Bacteria, UA: NONE SEEN
Bilirubin Urine: NEGATIVE
Glucose, UA: 150 mg/dL — AB
Hgb urine dipstick: NEGATIVE
Ketones, ur: 5 mg/dL — AB
Leukocytes,Ua: NEGATIVE
Nitrite: NEGATIVE
Protein, ur: 30 mg/dL — AB
Specific Gravity, Urine: 1.018 (ref 1.005–1.030)
pH: 6 (ref 5.0–8.0)

## 2021-04-22 LAB — RESP PANEL BY RT-PCR (FLU A&B, COVID) ARPGX2
Influenza A by PCR: NEGATIVE
Influenza B by PCR: NEGATIVE
SARS Coronavirus 2 by RT PCR: NEGATIVE

## 2021-04-22 LAB — LACTIC ACID, PLASMA: Lactic Acid, Venous: 1.3 mmol/L (ref 0.5–1.9)

## 2021-04-22 MED ORDER — INSULIN ASPART 100 UNIT/ML IJ SOLN
0.0000 [IU] | Freq: Three times a day (TID) | INTRAMUSCULAR | Status: DC
  Administered 2021-04-23: 3 [IU] via SUBCUTANEOUS
  Administered 2021-04-23: 2 [IU] via SUBCUTANEOUS
  Filled 2021-04-22 (×3): qty 1

## 2021-04-22 MED ORDER — SODIUM CHLORIDE 0.9 % IV BOLUS (SEPSIS)
500.0000 mL | Freq: Once | INTRAVENOUS | Status: AC
  Administered 2021-04-22: 500 mL via INTRAVENOUS

## 2021-04-22 MED ORDER — POLYETHYLENE GLYCOL 3350 17 G PO PACK: 17.0000 g | PACK | Freq: Every day | ORAL | Status: DC | PRN

## 2021-04-22 MED ORDER — SODIUM CHLORIDE 0.9 % IV SOLN
500.0000 mg | INTRAVENOUS | Status: DC
  Administered 2021-04-23: 500 mg via INTRAVENOUS
  Filled 2021-04-22 (×2): qty 5

## 2021-04-22 MED ORDER — METFORMIN HCL 500 MG PO TABS
1000.0000 mg | ORAL_TABLET | Freq: Every day | ORAL | Status: DC
  Administered 2021-04-23: 1000 mg via ORAL
  Filled 2021-04-22: qty 2

## 2021-04-22 MED ORDER — PROPRANOLOL HCL 20 MG PO TABS
20.0000 mg | ORAL_TABLET | Freq: Two times a day (BID) | ORAL | Status: DC
  Administered 2021-04-23 (×2): 20 mg via ORAL
  Filled 2021-04-22 (×3): qty 1

## 2021-04-22 MED ORDER — SODIUM CHLORIDE 0.9 % IV BOLUS
1000.0000 mL | Freq: Once | INTRAVENOUS | Status: AC
  Administered 2021-04-22: 1000 mL via INTRAVENOUS

## 2021-04-22 MED ORDER — AZITHROMYCIN 500 MG IV SOLR
500.0000 mg | Freq: Once | INTRAVENOUS | Status: AC
  Administered 2021-04-22: 500 mg via INTRAVENOUS
  Filled 2021-04-22: qty 5

## 2021-04-22 MED ORDER — LEVOTHYROXINE SODIUM 137 MCG PO TABS
137.0000 ug | ORAL_TABLET | Freq: Every day | ORAL | Status: DC
  Filled 2021-04-22: qty 1

## 2021-04-22 MED ORDER — ONDANSETRON HCL 4 MG PO TABS: 4.0000 mg | ORAL_TABLET | Freq: Four times a day (QID) | ORAL | Status: DC | PRN

## 2021-04-22 MED ORDER — MELATONIN 5 MG PO TABS
2.5000 mg | ORAL_TABLET | Freq: Every evening | ORAL | Status: DC | PRN
  Administered 2021-04-26: 2.5 mg via ORAL
  Filled 2021-04-22: qty 1

## 2021-04-22 MED ORDER — CITALOPRAM HYDROBROMIDE 20 MG PO TABS
20.0000 mg | ORAL_TABLET | Freq: Every day | ORAL | Status: DC
  Administered 2021-04-23: 20 mg via ORAL
  Filled 2021-04-22 (×2): qty 1

## 2021-04-22 MED ORDER — HYDROCOD POLI-CHLORPHE POLI ER 10-8 MG/5ML PO SUER
5.0000 mL | Freq: Every evening | ORAL | Status: DC | PRN
  Administered 2021-04-23: 5 mL via ORAL
  Filled 2021-04-22: qty 5

## 2021-04-22 MED ORDER — ACETAMINOPHEN 500 MG PO TABS
1000.0000 mg | ORAL_TABLET | Freq: Once | ORAL | Status: DC
Start: 2021-04-22 — End: 2021-04-22
  Filled 2021-04-22: qty 2

## 2021-04-22 MED ORDER — LISINOPRIL 10 MG PO TABS: 20.0000 mg | ORAL_TABLET | Freq: Every day | ORAL | Status: DC

## 2021-04-22 MED ORDER — ENOXAPARIN SODIUM 40 MG/0.4ML IJ SOSY
40.0000 mg | PREFILLED_SYRINGE | INTRAMUSCULAR | Status: DC
  Administered 2021-04-23: 40 mg via SUBCUTANEOUS
  Filled 2021-04-22 (×2): qty 0.4

## 2021-04-22 MED ORDER — SODIUM CHLORIDE 0.9 % IV SOLN
2.0000 g | Freq: Once | INTRAVENOUS | Status: AC
  Administered 2021-04-22: 2 g via INTRAVENOUS
  Filled 2021-04-22 (×2): qty 20

## 2021-04-22 MED ORDER — SODIUM CHLORIDE 0.9 % IV SOLN
2.0000 g | INTRAVENOUS | Status: DC
  Administered 2021-04-23: 2 g via INTRAVENOUS
  Filled 2021-04-22 (×2): qty 20

## 2021-04-22 MED ORDER — ACETAMINOPHEN 325 MG PO TABS: 650.0000 mg | ORAL_TABLET | Freq: Four times a day (QID) | ORAL | Status: DC | PRN

## 2021-04-22 MED ORDER — INSULIN ASPART 100 UNIT/ML IJ SOLN: 0.0000 [IU] | Freq: Every day | INTRAMUSCULAR | Status: DC

## 2021-04-22 MED ORDER — DM-GUAIFENESIN ER 30-600 MG PO TB12
1.0000 | ORAL_TABLET | Freq: Two times a day (BID) | ORAL | Status: DC | PRN
  Administered 2021-04-24: 1 via ORAL
  Filled 2021-04-22: qty 1

## 2021-04-22 MED ORDER — ACETAMINOPHEN 10 MG/ML IV SOLN
1000.0000 mg | Freq: Four times a day (QID) | INTRAVENOUS | Status: DC
  Administered 2021-04-22 – 2021-04-23 (×3): 1000 mg via INTRAVENOUS
  Filled 2021-04-22 (×4): qty 100

## 2021-04-22 MED ORDER — ACETAMINOPHEN 650 MG RE SUPP: 650.0000 mg | Freq: Four times a day (QID) | RECTAL | Status: DC | PRN

## 2021-04-22 MED ORDER — ONDANSETRON HCL 4 MG/2ML IJ SOLN: 4.0000 mg | Freq: Four times a day (QID) | INTRAMUSCULAR | Status: DC | PRN

## 2021-04-22 NOTE — Assessment & Plan Note (Addendum)
-   Home citalopram 20 mg daily ?- Trazodone 25 mg BID, resumed for night of admission ?

## 2021-04-22 NOTE — ED Triage Notes (Addendum)
[facility] for [facility]MS and weakness. Daughter reports legs have been weaker this week and today patient cannot ambulate. Staff reported they were unable to get patient up today. Patient hard of hearing. Daughter also reports breathing is more heavy today. Patient also has cough present. Daughter reports patient had blood clot in right leg recently and her right foot noted to be swollen ?

## 2021-04-22 NOTE — Assessment & Plan Note (Addendum)
-   Chronic for many years and confirmed with daughter, Britta Mccreedy at bedside ?

## 2021-04-22 NOTE — Assessment & Plan Note (Addendum)
-   Patient takes  propanolol 20 mg p.o. twice daily as home medications, this has been resumed ?

## 2021-04-22 NOTE — H&P (Addendum)
History and Physical   Jessica Morse ZOX:[id number] DOB: 06-Nov-1926 DOA: 04/22/2021  PCP: Jessica Ali, MD  Outpatient Specialists: Dr. Mittie Morse, [facility] cardiology Patient coming from: [facility] via EMS  I have personally briefly reviewed patient's old medical records in [facility] EMR.  Chief Concern: Altered mental status  HPI: [name] is a 88 year old female with history of dementia, hypertension, insomnia, non-insulin-dependent diabetes mellitus, hypothyroid, seizure, history of tia, cva, depression, anxiety, who presents emergency department for chief concerns of worsening confusion and weakness with cough.  Initial vitals in the emergency department showed temperature of 1062.1, improved to 98.9, respiration rate of 21 and increased to 24, heart rate 83, blood pressure 157 Over 97, SPO2 100% on room air.  Serum sodium 134, potassium 4.0, chloride 98, bicarb 27, BUN of 20, serum creatinine of 1.10, GFR 47, nonfasting blood glucose 244, WBC 12.7, hemoglobin 11.8, platelets of 200.  Lactic acid was 1.3. UA was negative for leukocytes and nitrates.  Positive for 5 of ketones.  COVID/influenza A/influenza B PCR negative.  Blood cultures and urine cultures are pending.  CT of the chest without contrast was read as: Mild lingular and posterior bilateral lower lobe atelectasis and/or infiltrate.  Marked severity coronary artery disease.  Small hiatal hernia.  Aortic atherosclerosis.  Chest x-ray 2 view: Was read as no radiographic evidence of acute cardiopulmonary process.  Displaced fracture of the proximal left humerus.  ED treatment: Azithromycin 500 mg IV one-time dose, ceftriaxone 2 g IV one-time dose, sodium chloride 1 L bolus.  At bedside, patient is snoring.   Daughter has been having a nonproductive cough that daughter only noticed on day of admission. Patient did not know who anyone was, prompting concern. Patient could not feed  herself, and patient loves to eat. Daughter denies fever, sick contacts.   At baseline, patient does know her name, daugther, and her age. She would not know the current year and current president.   Daughter states patient was looking for her husband today, which is new and unusual for patient because their father has been deceased for 20 years.  Daughter is not certain when her last seizure was.  Social history: She is at assisted living. She is a former tobacco user, about quitting 40 years ago. She is not an etoh user. She is retired and formerly a Arts development officer.   Vaccination history: She is vaccinated for covid and influenza.   WJX:BJYNWG to complete due to dementia. Constitutional: no weight change, no fever ENT/Mouth: no sore throat, no rhinorrhea Eyes: no eye pain, no vision changes Cardiovascular: no chest pain, no dyspnea,  no edema, no palpitations Respiratory: no cough, no sputum, no wheezing Gastrointestinal: no nausea, no vomiting, no diarrhea, no constipation Genitourinary: no urinary incontinence, no dysuria, no hematuria Musculoskeletal: no arthralgias, no myalgias Skin: no skin lesions, no pruritus, Neuro: + weakness, no loss of consciousness, no syncope Psych: no anxiety, no depression, + decrease appetite Heme/Lymph: no bruising, no bleeding  ED Course: Discussed with emergency medicine provider, patient Hospitalization for chief concerns of altered mental status concerning for pneumonia.  Assessment/Plan  Principal Problem:   Sepsis (HCC) Active Problems:   Closed fracture of proximal end of left humerus   Essential hypertension   Diabetes mellitus type 2, noninsulin dependent (HCC)   Anxiety and depression   Hypothyroidism   History of seizure   Assessment and Plan:  * Sepsis (HCC) - Increased respiration rate, leukocytosis, source of pneumonia, altered mental status -  Continue ceftriaxone 2 g daily and azithromycin 500 mg IV daily to complete 5-day  course - Antitussives ordered as needed for cough - Blood cultures x2 ordered - Admit to telemetry medical, observation  History of seizure - Resumed home keppra dosing  Hypothyroidism - Levothyroxine 137 mcg daily before breakfast resumed  Anxiety and depression - Home citalopram 20 mg daily - Trazodone 25 mg BID, resumed for night of admission  Diabetes mellitus type 2, noninsulin dependent (HCC) - Patient takes glipizide 5 mg every morning, metformin 1000 mg nightly as a home medication - Glipizide has not been resumed on admission - Metformin 1000 mg nightly resumed - Insulin SSI with agents coverage ordered - Goal inpatient blood glucose levels 140-180  Essential hypertension - Patient takes  propanolol 20 mg p.o. twice daily as home medications, this has been resumed  Closed fracture of proximal end of left humerus - Chronic for many years and confirmed with daughter, Jessica Morse at bedside  Chart reviewed.   DVT prophylaxis: Enoxaparin Code Status: DNR, confirmed with daughter at bedside Diet: Heart healthy/carb modified Family Communication: updated Daughter, Jessica Morse at bedside Disposition Plan: Pending clinical course Consults called: None at this time Admission status: Telemetry medical, observation  Past Medical History:  Diagnosis Date   Arthritis    Diabetes mellitus without complication (HCC)    Hypertension    Thyroid disease    TIA (transient ischemic attack)    Past Surgical History:  Procedure Laterality Date   EYE SURGERY     Social History:  reports that she has quit smoking. Her smoking use included cigarettes. She has never used smokeless tobacco. She reports that she does not drink alcohol and does not use drugs.  Allergies  Allergen Reactions   Quinolones Anaphylaxis    Family history of reaction   Clonazepam Other (See Comments)   Clonidine Derivatives    Codeine    Iodinated Contrast Media    Propoxyphene    FMH: hypertension in two  daughters and one of the daughters have high cholesterol.  Family history: Family history reviewed and not pertinent  Prior to Admission medications   Medication Sig Start Date End Date Taking? Authorizing Provider  cephALEXin (KEFLEX) 500 MG capsule Take 500 mg by mouth 2 (two) times daily. 07/11/17  Yes [provider]  citalopram (CELEXA) 20 MG tablet Take 20 mg by mouth daily. 04/14/14  Yes [provider]  glipiZIDE (GLUCOTROL) 5 MG tablet Take 2.5 mg by mouth every morning. 08/16/17 04/22/21 Yes [provider]  levETIRAcetam (KEPPRA) 250 MG tablet Take 250-500 mg by mouth 2 (two) times daily. Take 250 by mouth in the morning and 500 mg by mouth at bedtime. 08/16/17  Yes [provider]  levothyroxine (SYNTHROID, LEVOTHROID) 137 MCG tablet Take 150 mcg by mouth daily before breakfast. 03/27/14  Yes [provider]  Melatonin 3 MG TABS Take 6 mg by mouth at bedtime.   Yes [provider]  metFORMIN (GLUCOPHAGE) 1000 MG tablet Take 500 mg by mouth 2 (two) times daily with a meal. 03/27/14  Yes [provider]  propranolol (INDERAL) 20 MG tablet Take 20 mg by mouth 2 (two) times daily. 05/03/17 04/22/21 Yes [provider]  traZODone (DESYREL) 50 MG tablet Take 25 mg by mouth 2 (two) times daily.   Yes [provider]  Artificial Saliva (BIOTENE DRY MOUTH MOISTURIZING) SOLN Take 1 spray by mouth 3 (three) times daily before meals. Patient not taking: Reported on 04/22/2021  [provider]  lisinopril (PRINIVIL,ZESTRIL) 20 MG tablet Take 20 mg by mouth daily. 12/28/16 04/22/21  [provider]  multivitamin-lutein (OCUVITE-LUTEIN) CAPS capsule Take 1 capsule by mouth daily. Patient not taking: Reported on 04/22/2021    [provider]  prednisoLONE acetate (PRED FORTE) 1 % ophthalmic suspension Place 1 drop into the right eye 4 (four) times daily. Patient not taking: Reported on 04/22/2021     [provider]   Physical Exam: Vitals:   04/22/21 1755 04/22/21 2113 04/22/21 2130 04/23/21 0034  BP:  (!) 130/52  (!) 102/53  Pulse:  74  70  Resp:  20  (!) 21  Temp:   98.9 F (37.2 C) 99.4 F (37.4 C)  TempSrc:   Oral Oral  SpO2:  96%  100%  Weight: 83.9 kg     Height: 5' (1.524 m)      Constitutional: appears age appropriate, frail, NAD, calm, comfortable Eyes: PERRL, lids and conjunctivae normal ENMT: Mucous membranes are moist. Posterior pharynx clear of any exudate or lesions. Age-appropriate dentition. Unable to assess hearing Neck: normal, supple, no masses, no thyromegaly Respiratory: clear to auscultation bilaterally, no wheezing, no crackles. Normal respiratory effort. No accessory muscle use.  Cardiovascular: Regular rate and rhythm, no murmurs / rubs / gallops. No extremity edema. 2+ pedal pulses. No carotid bruits.  Abdomen: obese abdomen, no tenderness, no masses palpated, no hepatosplenomegaly. Bowel sounds positive.  Musculoskeletal: no clubbing / cyanosis. No joint deformity upper and lower extremities. Good ROM, no contractures, no atrophy. Normal muscle tone.  Skin: no rashes, lesions, ulcers. No induration Neurologic: Sensation intact. Strength 5/5 in all 4.  Psychiatric: Unable to assess judgement and insight. Patient snoring peacefully.  EKG: independently reviewed, showing sinus rhythm with rate of 85, QTc 447   Chest x-ray on Admission: I personally reviewed and I agree with radiologist reading as below.  DG Chest 2 View  Result Date: 04/22/2021 CLINICAL DATA:  Altered mental status, weakness EXAM: CHEST - 2 VIEW COMPARISON:  None. FINDINGS: The cardiomediastinal silhouette is normal. Linear opacities in the left base on the frontal projection likely reflect subsegmental atelectasis. There is no focal consolidation or pulmonary edema. There is no pleural effusion or pneumothorax. There is a mildly displaced fracture of the proximal left humerus.  There is no other acute osseous abnormality. IMPRESSION: 1. No radiographic evidence of acute cardiopulmonary process. 2. Displaced fracture of the proximal left humerus. Electronically Signed   By: Lesia Hausen M.D.   On: 04/22/2021 18:19   CT CHEST WO CONTRAST  Result Date: 04/22/2021 CLINICAL DATA:  Altered mental status and weakness. EXAM: CT CHEST WITHOUT CONTRAST TECHNIQUE: Multidetector CT imaging of the chest was performed following the standard protocol without IV contrast. RADIATION DOSE REDUCTION: This exam was performed according to the departmental dose-optimization program which includes automated exposure control, adjustment of the mA and/or kV according to patient size and/or use of iterative reconstruction technique. COMPARISON:  None. FINDINGS: Cardiovascular: There is marked severity calcification of the thoracic aorta. The ascending thoracic aorta measures approximately 3.6 cm in diameter. Normal heart size with marked severity coronary artery calcification. No pericardial effusion. Mediastinum/Nodes: No enlarged mediastinal or axillary lymph nodes. Thyroid gland, trachea, and esophagus demonstrate no significant findings. Lungs/Pleura: Mild areas of atelectasis and/or infiltrate are seen within the lingular region and posterior aspect of the bilateral lower lobes. There is no evidence of a pleural effusion or pneumothorax. Upper Abdomen: There is a small hiatal hernia. Musculoskeletal: Marked  severity multilevel degenerative changes seen throughout the thoracic spine. IMPRESSION: 1. Mild lingular and posterior bilateral lower lobe atelectasis and/or infiltrate. 2. Marked severity coronary artery disease. 3. Small hiatal hernia. Aortic Atherosclerosis (ICD10-I70.0). Electronically Signed   By: Aram Candela M.D.   On: 04/22/2021 20:53    Labs on Admission: I have personally reviewed following labs  CBC: Recent Labs  Lab 04/22/21 1741  WBC 12.7*  HGB 11.8*  HCT 36.2  MCV 92.6   PLT 200   Basic Metabolic Panel: Recent Labs  Lab 04/22/21 1741  NA 134*  K 4.0  CL 98  CO2 27  GLUCOSE 244*  BUN 20  CREATININE 1.10*  CALCIUM 8.7*   GFR: Estimated Creatinine Clearance: 31.4 mL/min (A) (by C-G formula based on SCr of 1.1 mg/dL (H)).  Urine analysis:    Component Value Date/Time   COLORURINE YELLOW (A) 04/22/2021 2129   APPEARANCEUR CLEAR (A) 04/22/2021 2129   LABSPEC 1.018 04/22/2021 2129   PHURINE 6.0 04/22/2021 2129   GLUCOSEU 150 (A) 04/22/2021 2129   HGBUR NEGATIVE 04/22/2021 2129   BILIRUBINUR NEGATIVE 04/22/2021 2129   KETONESUR 5 (A) 04/22/2021 2129   PROTEINUR 30 (A) 04/22/2021 2129   NITRITE NEGATIVE 04/22/2021 2129   LEUKOCYTESUR NEGATIVE 04/22/2021 2129   Dr. Sedalia Muta [facility]  If 7PM-7AM, please contact overnight-coverage provider If 7AM-7PM, please contact day coverage provider [email or web address]  04/23/2021, 1:44 AM

## 2021-04-22 NOTE — ED Provider Notes (Signed)
? ?[facility] ?Provider Note ? ? ? Event Date/Time  ? First MD Initiated Contact with Patient 04/22/21 1814   ?  (approximate) ? ? ?History  ? ?Altered Mental Status and Fever ? ? ?HPI ? ?Brionne Doshier is a 88 y.o. female  with a h/o HTN, DM dementia who was brought to the ED due to acute confusion and generalized weakness, gradual onset over the past week.  Normally patient is able to ambulate and interactive, and now she is essentially bedbound, listless.  Seems to be breathing heavily today and has a congested cough. ? ?Discussed goals of care with the daughter, who also had her sister on the phone.  They advised the patient is being managed with palliative care but would want to treat acute illness such as infection.  They agree that the patient's current facility would have inadequate resources to manage acute illness and they agree with hospitalization as needed. ? ?  ? ? ?Physical Exam  ? ?Triage Vital Signs: ?ED Triage Vitals  ?Enc Vitals Group  ?   BP 04/22/21 1739 (!) 157/97  ?   Pulse Rate 04/22/21 1739 83  ?   Resp 04/22/21 1739 (!) 21  ?   Temp 04/22/21 1739 (!) 102.1 ?F (38.9 ?C)  ?   Temp Source 04/22/21 1739 Oral  ?   SpO2 04/22/21 1739 100 %  ?   Weight 04/22/21 1755 185 lb (83.9 kg)  ?   Height 04/22/21 1755 5' (1.524 m)  ?   Head Circumference --   ?   Peak Flow --   ?   Pain Score 04/22/21 2238 0  ?   Pain Loc --   ?   Pain Edu? --   ?   Excl. in [place]? --   ? ? ?Most recent vital signs: ?Vitals:  ? 04/22/21 2113 04/22/21 2130  ?BP: (!) 130/52   ?Pulse: 74   ?Resp: 20   ?Temp:  98.9 ?F (37.2 ?C)  ?SpO2: 96%   ? ? ? ?General: Awake, ill-appearing ?CV:  Good peripheral perfusion.  Regular rate and rhythm ?Resp:  Normal effort.  Tachypnea.  Bilateral basilar crackles. ?Abd:  No distention.  Soft and nontender ?Other:  Dry mucous membranes. ? ? ?ED Results / Procedures / Treatments  ? ?Labs ?(all labs ordered are listed, but only abnormal results are displayed) ?Labs Reviewed   ?BASIC METABOLIC PANEL - Abnormal; Notable for the following components:  ?    Result Value  ? Sodium 134 (*)   ? Glucose, Bld 244 (*)   ? Creatinine, Ser 1.10 (*)   ? Calcium 8.7 (*)   ? GFR, Estimated 47 (*)   ? All other components within normal limits  ?CBC - Abnormal; Notable for the following components:  ? WBC 12.7 (*)   ? Hemoglobin 11.8 (*)   ? All other components within normal limits  ?URINALYSIS, ROUTINE W REFLEX MICROSCOPIC - Abnormal; Notable for the following components:  ? Color, Urine YELLOW (*)   ? APPearance CLEAR (*)   ? Glucose, UA 150 (*)   ? Ketones, ur 5 (*)   ? Protein, ur 30 (*)   ? All other components within normal limits  ?RESP PANEL BY RT-PCR (FLU A&B, COVID) [id number]  ?CULTURE, BLOOD (ROUTINE X 2)  ?CULTURE, BLOOD (ROUTINE X 2)  ?URINE CULTURE  ?LACTIC ACID, PLASMA  ?LACTIC ACID, PLASMA  ?PROCALCITONIN  ?CBC  ?BASIC METABOLIC PANEL  ?HEMOGLOBIN A1C  ? ? ? ?  EKG ? ?Interpreted by me ?Normal sinus rhythm rate of 85.  Normal axis and intervals.  Poor R wave progression.  Normal ST segments and T waves.  No ischemic changes. ? ? ?RADIOLOGY ?Chest x-ray viewed and interpreted by me, unremarkable.  Radiology report reviewed. ? ?CT chest shows bilateral basilar infiltrates consistent with pneumonia. ? ? ? ?PROCEDURES: ? ?Critical Care performed: No ? ?Procedures ? ? ?MEDICATIONS ORDERED IN ED: ?Medications  ?acetaminophen (OFIRMEV) IV 1,000 mg (0 mg Intravenous Stopped 04/22/21 2043)  ?azithromycin (ZITHROMAX) 500 mg in sodium chloride 0.9 % 250 mL IVPB (500 mg Intravenous New Bag/Given 04/22/21 2216)  ?lisinopril (ZESTRIL) tablet 20 mg (has no administration in time range)  ?propranolol (INDERAL) tablet 20 mg (has no administration in time range)  ?citalopram (CELEXA) tablet 20 mg (has no administration in time range)  ?levothyroxine (SYNTHROID) tablet 137 mcg (has no administration in time range)  ?metFORMIN (GLUCOPHAGE) tablet 1,000 mg (has no administration in time range)  ?melatonin tablet  2.5 mg (has no administration in time range)  ?acetaminophen (TYLENOL) tablet 650 mg (has no administration in time range)  ?  Or  ?acetaminophen (TYLENOL) suppository 650 mg (has no administration in time range)  ?ondansetron (ZOFRAN) tablet 4 mg (has no administration in time range)  ?  Or  ?ondansetron (ZOFRAN) injection 4 mg (has no administration in time range)  ?enoxaparin (LOVENOX) injection 40 mg (has no administration in time range)  ?sodium chloride 0.9 % bolus 500 mL (has no administration in time range)  ?cefTRIAXone (ROCEPHIN) 2 g in sodium chloride 0.9 % 100 mL IVPB (has no administration in time range)  ?azithromycin (ZITHROMAX) 500 mg in sodium chloride 0.9 % 250 mL IVPB (has no administration in time range)  ?polyethylene glycol (MIRALAX / GLYCOLAX) packet 17 g (has no administration in time range)  ?dextromethorphan-guaiFENesin (MUCINEX DM) 30-600 MG per 12 hr tablet 1 tablet (has no administration in time range)  ?chlorpheniramine-HYDROcodone 10-8 MG/5ML suspension 5 mL (has no administration in time range)  ?insulin aspart (novoLOG) injection 0-5 Units (has no administration in time range)  ?insulin aspart (novoLOG) injection 0-15 Units (has no administration in time range)  ?sodium chloride 0.9 % bolus 1,000 mL (0 mLs Intravenous Stopped 04/22/21 2128)  ?cefTRIAXone (ROCEPHIN) 2 g in sodium chloride 0.9 % 100 mL IVPB (0 g Intravenous Stopped 04/22/21 2237)  ? ? ? ?IMPRESSION / MDM / ASSESSMENT AND PLAN / ED COURSE  ?I reviewed the triage vital signs and the nursing notes. ?             ?               ? ?Differential diagnosis includes, but is not limited to, pneumonia, UTI, dehydration, electrolyte abnormality ? ?**The patient is on the cardiac monitor to evaluate for evidence of arrhythmia and/or significant heart rate changes.**} ? ?Patient presents with decreased energy, confusion, shortness of breath and cough.  She has a fever, but is not septic.  Chest x-ray unremarkable but CT chest  confirms infiltrate and pneumonia.  Other lab work-up is unremarkable.  Lactate is normal, no significant leukocytosis.  Urinalysis unremarkable.  I think that sepsis is ruled out.  Case discussed with hospitalist for further management. ?  ? ? ?FINAL CLINICAL IMPRESSION(S) / ED DIAGNOSES  ? ?Final diagnoses:  ?Community acquired pneumonia, unspecified laterality  ?Generalized weakness  ?Dementia, unspecified dementia severity, unspecified dementia type, unspecified whether behavioral, psychotic, or mood disturbance or anxiety ([place])  ? ? ? ?  Rx / DC Orders  ? ?ED Discharge Orders   ? ? None  ? ?  ? ? ? ?Note:  This document was prepared using Dragon voice recognition software and may include unintentional dictation errors. ?  ?Carrie Mew, MD ?04/22/21 2346 ? ?

## 2021-04-22 NOTE — Assessment & Plan Note (Signed)
-   Levothyroxine 137 mcg daily before breakfast resumed ?

## 2021-04-22 NOTE — Assessment & Plan Note (Addendum)
-   Increased respiration rate, leukocytosis, source of pneumonia, altered mental status ?- Continue ceftriaxone 2 g daily and azithromycin 500 mg IV daily to complete 5-day course ?- Antitussives ordered as needed for cough ?- Blood cultures x2 ordered ?- Admit to telemetry medical, observation ?

## 2021-04-22 NOTE — Assessment & Plan Note (Addendum)
-   Patient takes glipizide 5 mg every morning, metformin 1000 mg nightly as a home medication ?- Glipizide has not been resumed on admission ?- Metformin 1000 mg nightly resumed ?- Insulin SSI with agents coverage ordered ?- Goal inpatient blood glucose levels 140-180 ?

## 2021-04-22 NOTE — ED Notes (Signed)
Patient transported to X-ray 

## 2021-04-22 NOTE — Hospital Course (Addendum)
Jessica Morse is a 88 year old female with history of dementia, hypertension, insomnia, non-insulin-dependent diabetes mellitus, hypothyroid, seizure, history of tia, cva, depression, anxiety, who presents emergency department for chief concerns of worsening confusion and weakness with cough. ? ?Initial vitals in the emergency department showed temperature of 1062.1, improved to 98.9, respiration rate of 21 and increased to 24, heart rate 83, blood pressure 157 Over 97, SPO2 100% on room air. ? ?Serum sodium 134, potassium 4.0, chloride 98, bicarb 27, BUN of 20, serum creatinine of 1.10, GFR 47, nonfasting blood glucose 244, WBC 12.7, hemoglobin 11.8, platelets of 200. ? ?Lactic acid was 1.3. UA was negative for leukocytes and nitrates.  Positive for 5 of ketones. ? ?COVID/influenza A/influenza B PCR negative. ? ?Blood cultures and urine cultures are pending. ? ?CT of the chest without contrast was read as: Mild lingular and posterior bilateral lower lobe atelectasis and/or infiltrate.  Marked severity coronary artery disease.  Small hiatal hernia.  Aortic atherosclerosis. ? ?Chest x-ray 2 view: Was read as no radiographic evidence of acute cardiopulmonary process.  Displaced fracture of the proximal left humerus. ? ?ED treatment: Azithromycin 500 mg IV one-time dose, ceftriaxone 2 g IV one-time dose, sodium chloride 1 L bolus. ?

## 2021-04-23 ENCOUNTER — Encounter: Payer: Self-pay | Admitting: Internal Medicine

## 2021-04-23 DIAGNOSIS — R531 Weakness: Secondary | ICD-10-CM

## 2021-04-23 DIAGNOSIS — F32A Depression, unspecified: Secondary | ICD-10-CM

## 2021-04-23 DIAGNOSIS — F419 Anxiety disorder, unspecified: Secondary | ICD-10-CM

## 2021-04-23 DIAGNOSIS — Z20822 Contact with and (suspected) exposure to covid-19: Secondary | ICD-10-CM | POA: Diagnosis present

## 2021-04-23 DIAGNOSIS — F0394 Unspecified dementia, unspecified severity, with anxiety: Secondary | ICD-10-CM | POA: Diagnosis present

## 2021-04-23 DIAGNOSIS — Z87898 Personal history of other specified conditions: Secondary | ICD-10-CM

## 2021-04-23 DIAGNOSIS — K449 Diaphragmatic hernia without obstruction or gangrene: Secondary | ICD-10-CM | POA: Diagnosis present

## 2021-04-23 DIAGNOSIS — F0392 Unspecified dementia, unspecified severity, with psychotic disturbance: Secondary | ICD-10-CM | POA: Diagnosis present

## 2021-04-23 DIAGNOSIS — J189 Pneumonia, unspecified organism: Secondary | ICD-10-CM

## 2021-04-23 DIAGNOSIS — I10 Essential (primary) hypertension: Secondary | ICD-10-CM

## 2021-04-23 DIAGNOSIS — S42292K Other displaced fracture of upper end of left humerus, subsequent encounter for fracture with nonunion: Secondary | ICD-10-CM

## 2021-04-23 DIAGNOSIS — I2510 Atherosclerotic heart disease of native coronary artery without angina pectoris: Secondary | ICD-10-CM | POA: Diagnosis present

## 2021-04-23 DIAGNOSIS — M1990 Unspecified osteoarthritis, unspecified site: Secondary | ICD-10-CM | POA: Diagnosis present

## 2021-04-23 DIAGNOSIS — Z66 Do not resuscitate: Secondary | ICD-10-CM | POA: Diagnosis present

## 2021-04-23 DIAGNOSIS — F0393 Unspecified dementia, unspecified severity, with mood disturbance: Secondary | ICD-10-CM | POA: Diagnosis present

## 2021-04-23 DIAGNOSIS — E039 Hypothyroidism, unspecified: Secondary | ICD-10-CM

## 2021-04-23 DIAGNOSIS — G9341 Metabolic encephalopathy: Secondary | ICD-10-CM | POA: Diagnosis present

## 2021-04-23 DIAGNOSIS — R6520 Severe sepsis without septic shock: Secondary | ICD-10-CM

## 2021-04-23 DIAGNOSIS — Z87891 Personal history of nicotine dependence: Secondary | ICD-10-CM | POA: Diagnosis not present

## 2021-04-23 DIAGNOSIS — Z8673 Personal history of transient ischemic attack (TIA), and cerebral infarction without residual deficits: Secondary | ICD-10-CM | POA: Diagnosis not present

## 2021-04-23 DIAGNOSIS — F03911 Unspecified dementia, unspecified severity, with agitation: Secondary | ICD-10-CM | POA: Diagnosis present

## 2021-04-23 DIAGNOSIS — I700 Atherosclerosis of aorta: Secondary | ICD-10-CM | POA: Diagnosis present

## 2021-04-23 DIAGNOSIS — G9340 Encephalopathy, unspecified: Secondary | ICD-10-CM

## 2021-04-23 DIAGNOSIS — Z515 Encounter for palliative care: Secondary | ICD-10-CM | POA: Diagnosis not present

## 2021-04-23 DIAGNOSIS — X58XXXA Exposure to other specified factors, initial encounter: Secondary | ICD-10-CM | POA: Diagnosis present

## 2021-04-23 DIAGNOSIS — S42202A Unspecified fracture of upper end of left humerus, initial encounter for closed fracture: Secondary | ICD-10-CM | POA: Diagnosis present

## 2021-04-23 DIAGNOSIS — R627 Adult failure to thrive: Secondary | ICD-10-CM | POA: Diagnosis present

## 2021-04-23 DIAGNOSIS — R54 Age-related physical debility: Secondary | ICD-10-CM | POA: Diagnosis present

## 2021-04-23 DIAGNOSIS — A419 Sepsis, unspecified organism: Secondary | ICD-10-CM

## 2021-04-23 DIAGNOSIS — G4700 Insomnia, unspecified: Secondary | ICD-10-CM | POA: Diagnosis present

## 2021-04-23 DIAGNOSIS — E119 Type 2 diabetes mellitus without complications: Secondary | ICD-10-CM

## 2021-04-23 DIAGNOSIS — G40909 Epilepsy, unspecified, not intractable, without status epilepticus: Secondary | ICD-10-CM | POA: Diagnosis present

## 2021-04-23 DIAGNOSIS — F0390 Unspecified dementia without behavioral disturbance: Secondary | ICD-10-CM

## 2021-04-23 LAB — CBC
HCT: 32.7 % — ABNORMAL LOW (ref 36.0–46.0)
Hemoglobin: 10.6 g/dL — ABNORMAL LOW (ref 12.0–15.0)
MCH: 30.4 pg (ref 26.0–34.0)
MCHC: 32.4 g/dL (ref 30.0–36.0)
MCV: 93.7 fL (ref 80.0–100.0)
Platelets: 177 10*3/uL (ref 150–400)
RBC: 3.49 MIL/uL — ABNORMAL LOW (ref 3.87–5.11)
RDW: 12.3 % (ref 11.5–15.5)
WBC: 9.9 10*3/uL (ref 4.0–10.5)
nRBC: 0 % (ref 0.0–0.2)

## 2021-04-23 LAB — GLUCOSE, CAPILLARY
Glucose-Capillary: 89 mg/dL (ref 70–99)
Glucose-Capillary: 186 mg/dL — ABNORMAL HIGH (ref 70–99)

## 2021-04-23 LAB — CBG MONITORING, ED
Glucose-Capillary: 159 mg/dL — ABNORMAL HIGH (ref 70–99)
Glucose-Capillary: 136 mg/dL — ABNORMAL HIGH (ref 70–99)

## 2021-04-23 LAB — BASIC METABOLIC PANEL
Anion gap: 7 (ref 5–15)
BUN: 17 mg/dL (ref 8–23)
CO2: 28 mmol/L (ref 22–32)
Calcium: 8.2 mg/dL — ABNORMAL LOW (ref 8.9–10.3)
Chloride: 101 mmol/L (ref 98–111)
Creatinine, Ser: 1.03 mg/dL — ABNORMAL HIGH (ref 0.44–1.00)
GFR, Estimated: 51 mL/min — ABNORMAL LOW (ref >60)
Glucose, Bld: 190 mg/dL — ABNORMAL HIGH (ref 70–99)
Potassium: 3.9 mmol/L (ref 3.5–5.1)
Sodium: 136 mmol/L (ref 135–145)

## 2021-04-23 LAB — PROCALCITONIN: Procalcitonin: <0.1 ng/mL

## 2021-04-23 LAB — LACTIC ACID, PLASMA: Lactic Acid, Venous: 1.2 mmol/L (ref 0.5–1.9)

## 2021-04-23 LAB — STREP PNEUMONIAE URINARY ANTIGEN: Strep Pneumo Urinary Antigen: NEGATIVE

## 2021-04-23 MED ORDER — LEVETIRACETAM 250 MG PO TABS
250.0000 mg | ORAL_TABLET | Freq: Every day | ORAL | Status: DC
  Administered 2021-04-23 – 2021-04-27 (×4): 250 mg via ORAL
  Filled 2021-04-23 (×5): qty 1

## 2021-04-23 MED ORDER — LEVOTHYROXINE SODIUM 50 MCG PO TABS
150.0000 ug | ORAL_TABLET | Freq: Every day | ORAL | Status: DC
  Administered 2021-04-24 – 2021-04-27 (×4): 150 ug via ORAL
  Filled 2021-04-23 (×5): qty 1

## 2021-04-23 MED ORDER — TRAZODONE HCL 50 MG PO TABS
25.0000 mg | ORAL_TABLET | Freq: Two times a day (BID) | ORAL | Status: DC
  Administered 2021-04-23 (×2): 25 mg via ORAL
  Filled 2021-04-23 (×3): qty 1

## 2021-04-23 MED ORDER — LEVETIRACETAM 250 MG PO TABS: 250.0000 mg | ORAL_TABLET | ORAL | Status: DC

## 2021-04-23 MED ORDER — LEVETIRACETAM 500 MG PO TABS
500.0000 mg | ORAL_TABLET | Freq: Every day | ORAL | Status: DC
  Administered 2021-04-23 – 2021-04-26 (×4): 500 mg via ORAL
  Filled 2021-04-23 (×4): qty 1

## 2021-04-23 NOTE — Progress Notes (Signed)
Patient was seen and examined.  Patient was admitted early morning hours by nighttime hospitalist.  Her daughter was at the bedside.  Patient is from assisted living facility.  She was coughing intermittently, she woke up and asked for TV remote otherwise mostly sleepy. ? ?In brief, 88 year old with history of dementia, frequent TIAs, hypertension, insomnia, type 2 diabetes, hypothyroidism, seizure disorder who was brought to the ER with worsening confusion and weakness with cough.  In the emergency room she had temperature 102.1, she was on room air.  WBC count 12.7.  UA was normal.  COVID and influenza negative.  CT scan of the chest with bilateral lower lobe infiltrates.  Blood cultures and urine cultures were drawn and patient was started on antibiotics for pneumonia.  Remains in overall poor clinical status. ? ?Bilateral lower lobe pneumonia in a patient with advanced medical issues, acute metabolic encephalopathy with underlying dementia, recurrent TIAs and seizure disorder: ?Agree with admission to the hospital given severity of symptoms. ?Started on Rocephin azithromycin, will continue. ?Chest physiotherapy, incentive spirometry, deep breathing exercises as much she can do. sputum induction, mucolytic's and bronchodilators. ?Sputum cultures, blood cultures, Legionella and streptococcal antigen. ?Supplemental oxygen to keep saturations more than 90%. ?Speech therapy evaluation, rule out aspiration. ?Continue reasonable treatment with antibiotics and supportive care, due to very advanced medical issues, if any deterioration will need to discuss about comfort care and hospice.  Daughter is agreeable. ? ? ?Total time spent: 35 minutes.  Same-day admit.  No charge visit. ?

## 2021-04-23 NOTE — Evaluation (Signed)
Physical Therapy Evaluation ?Patient Details ?Name: Jessica Morse ?MRN: [id number] ?DOB: 1926/03/26 ?Today's Date: 04/23/2021 ? ?History of Present Illness ? the patient is a 88 yo female that presented to ED with decreased energy, confusion, shortness of breath, cough, fever. PMH of dementia, hypertension, insomnia, non-insulin-dependent diabetes mellitus, hypothyroid, seizure, history of tia, cva, depression, anxiety. ?  ?Clinical Impression ? Patient lethargic, but does wake to touch and voice, did not speak to PT throughout session. The patient was able to follow one step commands with repetition and extended time. Supine to sit with modA, and sit <> Stand with minAx2 handheld assist, where she was able to take a few side steps towards the [facility] as well, and one step forwards/backwards. Pt did appear tremulous and fatigued at end of session. Returned to supine with maxAx2 for safety.  Overall the patient demonstrated deficits (see "PT Problem List") that impede the patient's functional abilities, safety, and mobility and would benefit from skilled PT intervention. Per chart review, pt previously at ALF, ambulatory. Recommendation at this time is SNF due to acute decline in functional status pending further pt progression with mobility and ability to participate.    ?   ? ?Recommendations for follow up therapy are one component of a multi-disciplinary discharge planning process, led by the attending physician.  Recommendations may be updated based on patient status, additional functional criteria and insurance authorization. ? ?Follow Up Recommendations Skilled nursing-short term rehab (<3 hours/day) ? ?  ?Assistance Recommended at Discharge Frequent or constant Supervision/Assistance  ?Patient can return home with the following ? A lot of help with walking and/or transfers;A lot of help with bathing/dressing/bathroom;Assistance with feeding;Assist for transportation;Assistance with cooking/housework;Help with  stairs or ramp for entrance ? ?  ?Equipment Recommendations Other (comment) (TBD)  ?Recommendations for Other Services ?    ?  ?Functional Status Assessment Patient has had a recent decline in their functional status and demonstrates the ability to make significant improvements in function in a reasonable and predictable amount of time.  ? ?  ?Precautions / Restrictions Precautions ?Precautions: Fall ?Precaution Comments: chronic L humerus fx ?Restrictions ?Weight Bearing Restrictions: No  ? ?  ? ?Mobility ? Bed Mobility ?Overal bed mobility: Needs Assistance ?Bed Mobility: Supine to Sit, Sit to Supine ?  ?  ?Supine to sit: Mod assist ?Sit to supine: Max assist, +2 for safety/equipment ?  ?General bed mobility comments: assist with trunk support and B LEs ?  ? ?Transfers ?Overall transfer level: Needs assistance ?Equipment used: 2 person hand held assist ?Transfers: Sit to/from Stand ?Sit to Stand: Min assist ?  ?  ?  ?  ?  ?General transfer comment: min HHA for side steps along EOB ?  ? ?Ambulation/Gait ?  ?  ?  ?  ?  ?  ?  ?  ? ?Stairs ?  ?  ?  ?  ?  ? ?Wheelchair Mobility ?  ? ?Modified Rankin (Stroke Patients Only) ?  ? ?  ? ?Balance Overall balance assessment: Needs assistance ?Sitting-balance support: Feet supported ?Sitting balance-Leahy Scale: Fair ?  ?  ?Standing balance support: During functional activity ?Standing balance-Leahy Scale: Poor ?  ?  ?  ?  ?  ?  ?  ?  ?  ?  ?  ?  ?   ? ? ? ?Pertinent Vitals/Pain Pain Assessment ?Pain Assessment: Faces ?Faces Pain Scale: Hurts a little bit  ? ? ?Home Living Family/patient expects to be discharged to:: Other (Comment) ?  ?  ?  ?  ?  ?  ?  ?  ?  ?  Additional Comments: Per daughter, pt has been living in memory care facility and getting OT and PT services.  ?  ?Prior Function Prior Level of Function : Needs assist ?  ?  ?  ?  ?  ?  ?Mobility Comments: Pt was ambulating with RW without assistance ~ 3 weeks ago ?ADLs Comments: Pt needs assistance with toilet hygiene,  clothing management, and bathing. ?  ? ? ?Hand Dominance  ? Dominant Hand: Right ? ?  ?Extremity/Trunk Assessment  ? Upper Extremity Assessment ?Upper Extremity Assessment: Defer to OT evaluation ?LUE Deficits / Details: L chronic humerus fx ?  ? ?Lower Extremity Assessment ?Lower Extremity Assessment: Generalized weakness ?  ? ?Cervical / Trunk Assessment ?Cervical / Trunk Assessment: Kyphotic  ?Communication  ? Communication: Expressive difficulties  ?Cognition Arousal/Alertness: Awake/alert ?Behavior During Therapy: [facility] for tasks assessed/performed ?Overall Cognitive Status: History of cognitive impairments - at baseline ?  ?  ?  ?  ?  ?  ?  ?  ?  ?  ?  ?  ?  ?  ?  ?  ?General Comments: Pt is HOH, did not speak to PT, but followed simple one step commands with repetition ?  ?  ? ?  ?General Comments   ? ?  ?Exercises    ? ?Assessment/Plan  ?  ?PT Assessment Patient needs continued PT services  ?PT Problem List Decreased strength;Decreased mobility;Decreased activity tolerance;Decreased coordination;Decreased knowledge of precautions;Decreased balance;Decreased cognition ? ?   ?  ?PT Treatment Interventions DME instruction;Therapeutic exercise;Gait training;Balance training;Stair training;Neuromuscular re-education;Functional mobility training;Therapeutic activities;Patient/family education   ? ?PT Goals (Current goals can be found in the Care Plan section)  ?Acute Rehab PT Goals ?PT Goal Formulation: Patient unable to participate in goal setting ?Time For Goal Achievement: 05/07/21 ?Potential to Achieve Goals: Fair ? ?  ?Frequency Min 2X/week ?  ? ? ?Co-evaluation   ?  ?  ?  ?  ? ? ?  ?AM-PAC PT "6 Clicks" Mobility  ?Outcome Measure Help needed turning from your back to your side while in a flat bed without using bedrails?: A Lot ?Help needed moving from lying on your back to sitting on the side of a flat bed without using bedrails?: A Lot ?Help needed moving to and from a bed to a chair (including a  wheelchair)?: A Lot ?Help needed standing up from a chair using your arms (e.g., wheelchair or bedside chair)?: A Lot ?Help needed to walk in hospital room?: A Lot ?Help needed climbing 3-5 steps with a railing? : Total ?6 Click Score: 11 ? ?  ?End of Session   ?Activity Tolerance: Patient tolerated treatment well ?Patient left: in bed;with call bell/phone within reach;with bed alarm set ?Nurse Communication: Mobility status ?PT Visit Diagnosis: Difficulty in walking, not elsewhere classified (R26.2);Other abnormalities of gait and mobility (R26.89);Muscle weakness (generalized) (M62.81) ?  ? ?Time: 1350-1401 ?PT Time Calculation (min) (ACUTE ONLY): 11 min ? ? ?Charges:   PT Evaluation ?$PT Eval Moderate Complexity: 1 Mod ?  ?  ?   ? ? ?Olga Coaster PT, DPT ?2:23 PM,04/23/21 ? ?

## 2021-04-23 NOTE — ED Notes (Signed)
MD at bedside. 

## 2021-04-23 NOTE — Assessment & Plan Note (Signed)
-   Resumed home keppra dosing ?

## 2021-04-23 NOTE — Care Management Obs Status (Signed)
MEDICARE OBSERVATION STATUS NOTIFICATION ? ? ?Patient Details  ?Name: Jessica Morse ?MRN: [id number] ?Date of Birth: November 09, 1926 ? ? ?Medicare Observation Status Notification Given:  Yes ? ? ? ?Shelbie Hutching, RN ?04/23/2021, 12:54 PM ?

## 2021-04-23 NOTE — Progress Notes (Signed)
PT Cancellation Note ? ?Patient Details ?Name: Jessica Morse ?MRN: [id number] ?DOB: 1926-06-14 ? ? ?Cancelled Treatment:    Reason Eval/Treat Not Completed: Other (comment). Pt with speech at bedside, PT to re-attempt as able. ? ? ?Olga Coaster PT, DPT ?9:21 AM,04/23/21 ? ?

## 2021-04-23 NOTE — Evaluation (Signed)
Clinical/Bedside Swallow Evaluation ?Patient Details  ?Name: Jessica Morse ?MRN: [id number] ?Date of Birth: April 16, 1926 ? ?Today's Date: 04/23/2021 ?Time: SLP Start Time (ACUTE ONLY): 0825 SLP Stop Time (ACUTE ONLY): 0930 ?SLP Time Calculation (min) (ACUTE ONLY): 65 min ? ?Past Medical History:  ?Past Medical History:  ?Diagnosis Date  ? Arthritis   ? Diabetes mellitus without complication (HCC)   ? Hypertension   ? Thyroid disease   ? TIA (transient ischemic attack)   ? ?Past Surgical History:  ?Past Surgical History:  ?Procedure Laterality Date  ? EYE SURGERY    ? ?HPI:  ?Pt  is a 88 year old female with history of Dementia living at Memory Care at [facility] facility, hypertension, insomnia, non-insulin-dependent diabetes mellitus, hypothyroid, seizure, history of tia, cva, depression, anxiety, who presents emergency department for chief concerns of worsening confusion and weakness with cough. Pt resides at [facility]; has been receieving Palliative Care services for GOC from [facility] per Dtr.   ?CT of Chest: Mild lingular and posterior bilateral lower lobe atelectasis  and/or infiltrate.  2. Marked severity coronary artery disease.  3. Small hiatal hernia.  ?  ?Assessment / Plan / Recommendation  ?Clinical Impression ? Pt seen for BSE; mild drowsiness but awakened w/ full stim and cues. Was able to hold her own Cup when drinking. She smiled often; speech volume low and a few mumbled speech attempts. She followed a few basic instructions w/ MOD+ cues. Pt has a Baseline of Dementia.  ?Pt is Edentulous -- unable to wear her Dentures d/t ill-fit baseline, per Dtr. Discussed the reasons for not wanting to attempt to wear them if loose as described. Dtr endorsed the pt seemed to "choke" on foods more easily trying to use the Dentures to chew and that pt has been less successful at chewing foods w/out the Dentures.  ? ?She appears to present w/ oropharyngeal phase dysphagia in setting of declined Cognitive status;  Baseline Dementia and Edentulous status. These issues can impact her overall awareness/timing of mastication and swallowing, and safety during po tasks, which increases risk for aspiration, choking. Pt's risk for aspiration can be reduced when following aspiration precautions, supporting feeding at meals, and using a modified diet consistency w/ Nectar liquids.  ? ?Pt consumed several trials of ice chips, purees, thin and Nectar liquids w/ overt clinical s/s of aspiration noted w/ thin liquids -- suspect impact from ease of distraction and delayed pharyngeal swallow initiation w/ thin liquid viscosity. No overt clinical s/s of aspiration noted w/ Nectar consistency liquids or puree foods; no decline in vocal quality/phonation; no cough, and no decline in respiratory status during/post trials. O2 sats remained 98%. Oral phase was adequate for bolus management and oral clearing of the boluses given. Oral clearing was timely and complete. No solid foods were assessed d/t Edentulous status, Cognitive decline. Pt attempted self-feeding by holding her own Cup during drinking -- this provides more hand-to-mouth engagement and Cognitive engagement which improves safety of swallowing.  ?OM Exam appeared [facility] w/ bolus management and lingual movements; No unilateral weakness noted. Some confusion of OM tasks and oral care.      ? ?D/t pt's Baseline declined Cognitive status (Dementia), her Edentulous status, and risk for aspiration w/ Pulmonary decline/impact, recommend modifying diet to a dysphagia level 1(puree) w/ Nectar liquids via Cup -- pt to help hold; aspiration precautions; reduce Distractions during meals and engage pt during meals for self-feeding. Pills Crushed in Puree for safer swallowing. Support w/ feeding at meals as needed. MD/NSG updated.  ?  ST services can f/u w/ pt's status post return to her Living Facility (known environment) and post Acute illness for ongoing assessment of swallowing and trials to upgrade  diet if appropriate.  ?ST services recommends continued follow w/ Palliative Care for GOC and education re: impact of Cognitive decline/Dementia and advanced age on swallowing. Largely suspect that pt's Dementia and advanced age could hamper upgrade of diet back to thin liquids. Precautions posted in room; handouts on diet consistency and Nectar liquids given to Daughter present. Thorough Education w/ Daughter on benefits of dysphagia diet; impact of Cognitive decline/Dementia and aging on swallowing; Nectar consistency liquids; aspiration precautions; food consistencies and use of condiments for flavoring. Daughter agreed.  ?SLP Visit Diagnosis: Dysphagia, oropharyngeal phase (R13.12) (Baseline Dementia; advanced age) ?   ?Aspiration Risk ? Mild aspiration risk;Moderate aspiration risk;Risk for inadequate nutrition/hydration  ?  ?Diet Recommendation   dysphagia level 1(puree) w/ Nectar liquids via Cup -- pt to help hold; aspiration precautions; reduce Distractions during meals and engage pt during meals for self-feeding. Support w/ feeding at meals as needed. ? ?Medication Administration: Crushed with puree  ?  ?Other  Recommendations Recommended Consults:  (Dietician f/u; Palliative Care f/u) ?Oral Care Recommendations: Oral care BID;Oral care before and after PO;Staff/trained caregiver to provide oral care ?Other Recommendations: Order thickener from pharmacy;Prohibited food (jello, ice cream, thin soups);Remove water pitcher;Have oral suction available   ? ?Recommendations for follow up therapy are one component of a multi-disciplinary discharge planning process, led by the attending physician.  Recommendations may be updated based on patient status, additional functional criteria and insurance authorization. ? ?Follow up Recommendations Skilled nursing-short term rehab (<3 hours/day)  ? ? ?  ?Assistance Recommended at Discharge Frequent or constant Supervision/Assistance  ?Functional Status Assessment Patient  has had a recent decline in their functional status and/or demonstrates limited ability to make significant improvements in function in a reasonable and predictable amount of time  ?Frequency and Duration  (n/a)  ? (n/a) ?  ?   ? ?Prognosis Prognosis for Safe Diet Advancement: Fair ?Barriers to Reach Goals: Cognitive deficits;Language deficits;Time post onset;Severity of deficits ?Barriers/Prognosis Comment: baseline Dementia; advanced age  ? ?  ? ?Swallow Study   ?General Date of Onset: 04/22/21 ?HPI: Pt  is a 88 year old female with history of Dementia living at [facility] at [facility] facility, hypertension, insomnia, non-insulin-dependent diabetes mellitus, hypothyroid, seizure, history of tia, cva, depression, anxiety, who presents emergency department for chief concerns of worsening confusion and weakness with cough.   CT of Chest: Mild lingular and posterior bilateral lower lobe atelectasis  and/or infiltrate.  2. Marked severity coronary artery disease.  3. Small hiatal hernia. ?Type of Study: Bedside Swallow Evaluation ?Previous Swallow Assessment: none ?Diet Prior to this Study: Regular;Thin liquids ?Temperature Spikes Noted: No (wbc 9.9) ?Respiratory Status: Room air ?History of Recent Intubation: No ?Behavior/Cognition: Alert;Cooperative;Pleasant mood;Confused;Requires cueing;Distractible (once awakened from drowsy State; few words but mumbled) ?Oral Cavity Assessment: Dry (sticky on tongue) ?Oral Care Completed by SLP: Yes ?Oral Cavity - Dentition: Edentulous (Dentures are ill-fitting) ?Vision: Functional for self-feeding ?Self-Feeding Abilities: Able to feed self;Needs assist;Needs set up;Total assist (can Hold Cup and feed self drink) ?Patient Positioning: Upright in bed (needed full positioning support) ?Baseline Vocal Quality: Low vocal intensity (mumbled) ?Volitional Cough: Cognitively unable to elicit ?Volitional Swallow: Unable to elicit  ?  ?Oral/Motor/Sensory Function Overall Oral  Motor/Sensory Function: Within functional limits   ?Ice Chips Ice chips: Within functional limits ?Presentation: Spoon (fed; 3 trials)   ?  Thin Liquid Thin Liquid: Impaired ?Presentation: Cup;Self Fed;Straw (4 trials)

## 2021-04-23 NOTE — Evaluation (Signed)
Occupational Therapy Evaluation ?Patient Details ?Name: Jessica Morse ?MRN: [id number] ?DOB: 12/09/1926 ?Today's Date: 04/23/2021 ? ? ?History of Present Illness 88 y.o. female  with a h/o HTN, DM dementia who was brought to the ED due to acute confusion and generalized weakness, gradual onset over the past week.  Normally patient is able to ambulate and interactive, and now she is essentially bedbound, listless.  ? ?Clinical Impression ?  ?Patient presenting with decreased Ind in self care, balance, functional mobility/transfers, endurance, and safety awareness. Patient's daughter present in room to confirm baseline. Prior to 3 weeks ago, pt was able to ambulate with RW without assistance. She does need assist for ADLs and hygiene at baseline. Pt is not at a memory care unit and receiving therapy there. At baseline, she is oriented to self and family but not time, location, or situation. She was pleasant this session and followed commands with min multimodal cuing and increased time. Pt needing mod A for trunk support and B LEs to EOB. Pt initiated and showing good effort but assisted for energy conservation. Pt maintained sitting balance with close supervision. She has chronic L humerus fx that she did display some discomfort in with mobility. Pt stands with min HHA and takes several side steps before returning to bed. She fatigues quickly. Patient will benefit from acute OT to increase overall independence in the areas of ADLs, functional mobility, and safety awareness in order to safely discharge to next venue of care.  ?   ? ?Recommendations for follow up therapy are one component of a multi-disciplinary discharge planning process, led by the attending physician.  Recommendations may be updated based on patient status, additional functional criteria and insurance authorization.  ? ?Follow Up Recommendations ? Skilled nursing-short term rehab (<3 hours/day)  ?  ?Assistance Recommended at Discharge Frequent or  constant Supervision/Assistance  ?Patient can return home with the following A lot of help with walking and/or transfers;A lot of help with bathing/dressing/bathroom;Direct supervision/assist for medications management;Direct supervision/assist for financial management ? ?  ?Functional Status Assessment ? Patient has had a recent decline in their functional status and demonstrates the ability to make significant improvements in function in a reasonable and predictable amount of time.  ?Equipment Recommendations ? Other (comment) (defer to next venue of care)  ?  ?   ?Precautions / Restrictions Precautions ?Precautions: Fall ?Precaution Comments: chronic L humerus fx  ? ?  ? ?Mobility Bed Mobility ?Overal bed mobility: Needs Assistance ?Bed Mobility: Supine to Sit, Sit to Supine ?  ?  ?Supine to sit: Mod assist ?Sit to supine: Max assist ?  ?General bed mobility comments: assist with trunk support and B LEs ?  ? ?Transfers ?Overall transfer level: Needs assistance ?Equipment used: 2 person hand held assist ?Transfers: Sit to/from Stand ?Sit to Stand: Min assist ?  ?  ?  ?  ?  ?General transfer comment: min HHA for side steps along EOB ?  ? ?  ?Balance Overall balance assessment: Needs assistance ?Sitting-balance support: Feet supported ?Sitting balance-Leahy Scale: Fair ?  ?  ?Standing balance support: During functional activity ?Standing balance-Leahy Scale: Poor ?  ?  ?  ?  ?  ?  ?  ?  ?  ?  ?  ?  ?   ? ?ADL either performed or assessed with clinical judgement  ? ?ADL Overall ADL's : Needs assistance/impaired ?  ?  ?  ?  ?  ?  ?  ?  ?Upper Body Dressing :  Moderate assistance ?Upper Body Dressing Details (indicate cue type and reason): to change gown ?  ?  ?  ?  ?  ?  ?  ?  ?  ?   ? ? ? ?Vision Patient Visual Report: No change from baseline ?   ?   ?   ?   ? ?Pertinent Vitals/Pain Pain Assessment ?Pain Assessment: Faces ?Faces Pain Scale: Hurts a little bit ?Pain Location: L UE ?Pain Descriptors / Indicators:  Discomfort ?Pain Intervention(s): Limited activity within patient's tolerance, Repositioned, Monitored during session  ? ? ? ?Hand Dominance Right ?  ?Extremity/Trunk Assessment Upper Extremity Assessment ?Upper Extremity Assessment: Generalized weakness;LUE deficits/detail ?LUE Deficits / Details: L chronic humerus fx ?  ?Lower Extremity Assessment ?Lower Extremity Assessment: Generalized weakness ?  ?  ?  ?Communication Communication ?Communication: Expressive difficulties ?  ?Cognition Arousal/Alertness: Awake/alert ?Behavior During Therapy: [facility] for tasks assessed/performed ?Overall Cognitive Status: History of cognitive impairments - at baseline ?  ?  ?  ?  ?  ?  ?  ?  ?  ?  ?  ?  ?  ?  ?  ?  ?General Comments: Pt is normally oriented to self and family. She does not know location, situation, or time. She is HOH and needing min- mod multimodal cuing to sequence and initiate. ?  ?  ?   ?   ?   ? ? ?Home Living Family/patient expects to be discharged to:: Other (Comment) ?  ?  ?  ?  ?  ?  ?  ?  ?  ?  ?  ?  ?  ?  ?  ?  ?Additional Comments: Per daughter, pt has been living in memory care facility and getting OT and PT services. ?  ? ?  ?Prior Functioning/Environment Prior Level of Function : Needs assist ?  ?  ?  ?  ?  ?  ?Mobility Comments: Pt was ambulating with RW without assistance ~ 3 weeks ago ?ADLs Comments: Pt needs assistance with toilet hygiene, clothing management, and bathing. ?  ? ?  ?  ?OT Problem List: Decreased strength;Decreased activity tolerance;Decreased cognition;Impaired balance (sitting and/or standing);Decreased safety awareness ?  ?   ?OT Treatment/Interventions: Self-care/ADL training;Manual therapy;Therapeutic exercise;Modalities;Patient/family education;Balance training;Energy conservation;Therapeutic activities;DME and/or AE instruction  ?  ?OT Goals(Current goals can be found in the care plan section) Acute Rehab OT Goals ?Patient Stated Goal: to return to memory care unit and get  therapy ?OT Goal Formulation: With patient/family ?Time For Goal Achievement: 05/07/21 ?Potential to Achieve Goals: Fair  ?OT Frequency: Min 2X/week ?  ? ?   ?AM-PAC OT "6 Clicks" Daily Activity     ?Outcome Measure Help from another person eating meals?: A Little ?Help from another person taking care of personal grooming?: A Little ?Help from another person toileting, which includes using toliet, bedpan, or urinal?: A Lot ?Help from another person bathing (including washing, rinsing, drying)?: A Lot ?Help from another person to put on and taking off regular upper body clothing?: A Lot ?Help from another person to put on and taking off regular lower body clothing?: A Lot ?6 Click Score: 14 ?  ?End of Session Nurse Communication: Mobility status ? ?Activity Tolerance: Patient tolerated treatment well ?Patient left: in bed;with call bell/phone within reach ? ?OT Visit Diagnosis: Unsteadiness on feet (R26.81);Muscle weakness (generalized) (M62.81);History of falling (Z91.81)  ?              ?Time: [id number] ?OT Time Calculation (  min): 20 min ?Charges:  OT General Charges ?$OT Visit: 1 Visit ?OT Evaluation ?$OT Eval Moderate Complexity: 1 Mod ?OT Treatments ?$Therapeutic Activity: 8-22 mins ? ?Darleen Crocker, MS, OTR/L , CBIS ?ascom [phone]  ?04/23/21, 1:12 PM  ?

## 2021-04-23 NOTE — TOC Initial Note (Signed)
Transition of Care (TOC) - Initial/Assessment Note  ? ? ?Patient Details  ?Name: Jessica Morse ?MRN: [id number] ?Date of Birth: 1926/01/14 ? ?Transition of Care (TOC) CM/SW Contact:    ?Allayne Butcher, RN ?Phone Number: ?04/23/2021, 1:03 PM ? ?Clinical Narrative:                 ?Patient placed under observation and now changed to inpatient status for community acquired pneumonia.  RNCM attempted to meet with patient but she is sleeping, was able to speak with daughter, Britta Mccreedy, introduced self and explained role in DC planning. ?Patient is from [facility], she has been there for about 5 weeks, before that she lived with her daughter Vernona Rieger.  Britta Mccreedy would like for the patient to return to [facility] at discharge.  We did discuss the possibility of short term rehab at discharge, patient was just getting started with therapy at [facility].  Daughter would prefer back to [facility] over rehab.  Patient was able to get around at [facility] with a walker.   ? ?TOC will cont to follow.  ? ?Expected Discharge Plan: Assisted Living ?Barriers to Discharge: Continued Medical Work up ? ? ?Patient Goals and CMS Choice ?Patient states their goals for this hospitalization and ongoing recovery are:: patient sleeping but daughter would like for patient to return to [facility] ?CMS Medicare.gov Compare Post Acute Care list provided to:: Patient Represenative (must comment) ?Choice offered to / list presented to : Adult Children ? ?Expected Discharge Plan and Services ?Expected Discharge Plan: Assisted Living ?  ?Discharge Planning Services: CM Consult ?Post Acute Care Choice: Resumption of Svcs/PTA Provider ?Living arrangements for the past 2 months: Assisted Living Facility ?                ?DME Arranged: N/A ?DME Agency: NA ?  ?  ?  ?  ?  ?  ?  ?  ? ?Prior Living Arrangements/Services ?Living arrangements for the past 2 months: Assisted Living Facility ?Lives with:: Facility Resident ?Patient language and need  for interpreter reviewed:: Yes ?Do you feel safe going back to the place where you live?: Yes      ?Need for Family Participation in Patient Care: Yes (Comment) ?Care giver support system in place?: Yes (comment) ?Current home services: DME (walker) ?Criminal Activity/Legal Involvement Pertinent to Current Situation/Hospitalization: No - Comment as needed ? ?Activities of Daily Living ?Home Assistive Devices/Equipment: Dan Humphreys (specify type), Wheelchair ?ADL Screening (condition at time of admission) ?Patient's cognitive ability adequate to safely complete daily activities?: Yes ?Is the patient deaf or have difficulty hearing?: No ?Does the patient have difficulty seeing, even when wearing glasses/contacts?: No ?Does the patient have difficulty concentrating, remembering, or making decisions?: Yes ?Patient able to express need for assistance with ADLs?: Yes ?Does the patient have difficulty dressing or bathing?: Yes ?Independently performs ADLs?: No ?Communication: Independent ?Dressing (OT): Needs assistance ?Is this a change from baseline?: Pre-admission baseline ?Grooming: Needs assistance ?Is this a change from baseline?: Pre-admission baseline ?Feeding: Needs assistance ?Is this a change from baseline?: Pre-admission baseline ?Bathing: Needs assistance ?Is this a change from baseline?: Pre-admission baseline ?Toileting: Needs assistance ?Is this a change from baseline?: Pre-admission baseline ?In/Out Bed: Needs assistance ?Is this a change from baseline?: Pre-admission baseline ?Walks in Home: Needs assistance ?Is this a change from baseline?: Pre-admission baseline ?Does the patient have difficulty walking or climbing stairs?: Yes ?Weakness of Legs: Both ?Weakness of Arms/Hands: None ? ?Permission Sought/Granted ?Permission sought to  share information with : Case Manager, Magazine features editor, Family Supports ?Permission granted to share information with : Yes, Verbal Permission Granted ? Share  Information with NAME: Cherre Blanc ? Permission granted to share info w AGENCY: [facility] ? Permission granted to share info w Relationship: daughter ? Permission granted to share info w Contact Information: [phone] ? ?Emotional Assessment ?Appearance:: Appears stated age ?Attitude/Demeanor/Rapport: Lethargic ?Affect (typically observed): Unable to Assess ?Orientation: : Oriented to Self, Oriented to Place, Oriented to  Time, Oriented to Situation ?Alcohol / Substance Use: Not Applicable ?Psych Involvement: No (comment) ? ?Admission diagnosis:  Sepsis (HCC) [A41.9] ?Patient Active Problem List  ? Diagnosis Date Noted  ? History of seizure 04/23/2021  ? Failure to thrive in adult 04/23/2021  ? Community acquired bilateral lower lobe pneumonia   ? Dementia (HCC)   ? Generalized weakness   ? Sepsis (HCC) 04/22/2021  ? Closed fracture of proximal end of left humerus 04/22/2021  ? Essential hypertension 04/22/2021  ? Diabetes mellitus type 2, noninsulin dependent (HCC) 04/22/2021  ? Anxiety and depression 04/22/2021  ? Hypothyroidism 04/22/2021  ? ?PCP:  Cameron Ali, MD ?Pharmacy:  No Pharmacies Listed ? ? ? ?Social Determinants of Health (SDOH) Interventions ?  ? ?Readmission Risk Interventions ?   ? View : No data to display.  ?  ?  ?  ? ? ? ?

## 2021-04-24 DIAGNOSIS — J189 Pneumonia, unspecified organism: Secondary | ICD-10-CM | POA: Diagnosis not present

## 2021-04-24 LAB — BASIC METABOLIC PANEL
Anion gap: 8 (ref 5–15)
BUN: 19 mg/dL (ref 8–23)
CO2: 25 mmol/L (ref 22–32)
Calcium: 8 mg/dL — ABNORMAL LOW (ref 8.9–10.3)
Chloride: 103 mmol/L (ref 98–111)
Creatinine, Ser: 1.12 mg/dL — ABNORMAL HIGH (ref 0.44–1.00)
GFR, Estimated: 46 mL/min — ABNORMAL LOW (ref >60)
Glucose, Bld: 155 mg/dL — ABNORMAL HIGH (ref 70–99)
Potassium: 3.7 mmol/L (ref 3.5–5.1)
Sodium: 136 mmol/L (ref 135–145)

## 2021-04-24 LAB — URINE CULTURE: Culture: <10000 — AB

## 2021-04-24 LAB — GLUCOSE, CAPILLARY: Glucose-Capillary: 158 mg/dL — ABNORMAL HIGH (ref 70–99)

## 2021-04-24 LAB — MAGNESIUM: Magnesium: 1.4 mg/dL — ABNORMAL LOW (ref 1.7–2.4)

## 2021-04-24 LAB — PHOSPHORUS: Phosphorus: 2.7 mg/dL (ref 2.5–4.6)

## 2021-04-24 MED ORDER — GLYCOPYRROLATE 0.2 MG/ML IJ SOLN: 0.2000 mg | INTRAMUSCULAR | Status: DC | PRN

## 2021-04-24 MED ORDER — ONDANSETRON 4 MG PO TBDP: 4.0000 mg | ORAL_TABLET | Freq: Four times a day (QID) | ORAL | Status: DC | PRN

## 2021-04-24 MED ORDER — HALOPERIDOL 0.5 MG PO TABS
0.5000 mg | ORAL_TABLET | ORAL | Status: DC | PRN
  Administered 2021-04-25: 0.5 mg via ORAL
  Filled 2021-04-24: qty 1

## 2021-04-24 MED ORDER — HALOPERIDOL LACTATE 5 MG/ML IJ SOLN
0.5000 mg | INTRAMUSCULAR | Status: DC | PRN
  Administered 2021-04-24 – 2021-04-26 (×3): 0.5 mg via INTRAVENOUS
  Filled 2021-04-24 (×6): qty 1

## 2021-04-24 MED ORDER — SCOPOLAMINE 1 MG/3DAYS TD PT72
1.0000 | MEDICATED_PATCH | TRANSDERMAL | Status: DC
  Administered 2021-04-24: 1.5 mg via TRANSDERMAL
  Filled 2021-04-24 (×2): qty 1

## 2021-04-24 MED ORDER — GLYCOPYRROLATE 1 MG PO TABS
1.0000 mg | ORAL_TABLET | ORAL | Status: DC | PRN
  Filled 2021-04-24: qty 1

## 2021-04-24 MED ORDER — HYDROMORPHONE HCL 1 MG/ML PO LIQD: 1.0000 mg | ORAL | Status: DC | PRN

## 2021-04-24 MED ORDER — HALOPERIDOL LACTATE 2 MG/ML PO CONC
0.5000 mg | ORAL | Status: DC | PRN
Start: 2021-04-24 — End: 2021-04-27
  Administered 2021-04-24 – 2021-04-27 (×4): 0.5 mg via SUBLINGUAL
  Filled 2021-04-24: qty 0.3
  Filled 2021-04-24 (×2): qty 0.25
  Filled 2021-04-24: qty 0.3
  Filled 2021-04-24: qty 0.25

## 2021-04-24 MED ORDER — AZITHROMYCIN 500 MG PO TABS: 500.0000 mg | ORAL_TABLET | Freq: Every day | ORAL | Status: DC

## 2021-04-24 MED ORDER — LORAZEPAM 1 MG PO TABS: 1.0000 mg | ORAL_TABLET | ORAL | Status: DC | PRN

## 2021-04-24 MED ORDER — ONDANSETRON HCL 4 MG/2ML IJ SOLN: 4.0000 mg | Freq: Four times a day (QID) | INTRAMUSCULAR | Status: DC | PRN

## 2021-04-24 MED ORDER — LORAZEPAM 2 MG/ML IJ SOLN
1.0000 mg | INTRAMUSCULAR | Status: DC | PRN
  Administered 2021-04-24: 1 mg via INTRAVENOUS
  Filled 2021-04-24: qty 1

## 2021-04-24 MED ORDER — MORPHINE SULFATE (CONCENTRATE) 10 MG/0.5ML PO SOLN: 5.0000 mg | ORAL | Status: DC | PRN

## 2021-04-24 MED ORDER — ALBUTEROL SULFATE (2.5 MG/3ML) 0.083% IN NEBU: 2.5000 mg | INHALATION_SOLUTION | RESPIRATORY_TRACT | Status: DC | PRN

## 2021-04-24 MED ORDER — MORPHINE SULFATE (CONCENTRATE) 10 MG/0.5ML PO SOLN
5.0000 mg | ORAL | Status: DC | PRN
  Administered 2021-04-26: 5 mg via ORAL
  Filled 2021-04-24 (×2): qty 0.5

## 2021-04-24 MED ORDER — MAGNESIUM SULFATE 2 GM/50ML IV SOLN
2.0000 g | Freq: Once | INTRAVENOUS | Status: DC
Start: 2021-04-24 — End: 2021-04-24
  Filled 2021-04-24: qty 50

## 2021-04-24 MED ORDER — GUAIFENESIN-DM 100-10 MG/5ML PO SYRP
5.0000 mL | ORAL_SOLUTION | ORAL | Status: DC | PRN
  Administered 2021-04-25: 5 mL via ORAL
  Filled 2021-04-24: qty 10

## 2021-04-24 MED ORDER — GLYCOPYRROLATE 0.2 MG/ML IJ SOLN
0.2000 mg | INTRAMUSCULAR | Status: DC | PRN
Start: 2021-04-24 — End: 2021-04-27

## 2021-04-24 MED ORDER — MORPHINE SULFATE (CONCENTRATE) 10 MG/0.5ML PO SOLN
5.0000 mg | ORAL | Status: DC | PRN
Start: 2021-04-24 — End: 2021-04-26
  Administered 2021-04-24: 5 mg via SUBLINGUAL
  Filled 2021-04-24: qty 0.5

## 2021-04-24 MED ORDER — LORAZEPAM 2 MG/ML PO CONC: 1.0000 mg | ORAL | Status: DC | PRN

## 2021-04-24 NOTE — Progress Notes (Signed)
PHARMACIST - PHYSICIAN COMMUNICATION ? ?CONCERNING: Antibiotic IV to Oral Route Change Policy ? ?RECOMMENDATION: ?This patient is receiving azithromycin by the intravenous route.  Based on criteria approved by the Pharmacy and Therapeutics Committee, the antibiotic(s) is/are being converted to the equivalent oral dose form(s). ? ? ?DESCRIPTION: ?These criteria include: ?Patient being treated for a respiratory tract infection, urinary tract infection, cellulitis or clostridium difficile associated diarrhea if on metronidazole ?The patient is not neutropenic and does not exhibit a GI malabsorption state ?The patient is eating (either orally or via tube) and/or has been taking other orally administered medications for a least 24 hours ?The patient is improving clinically and has a Tmax < 100.5 ? ?If you have questions about this conversion, please contact the Pharmacy Department  ? ?Alanya Vukelich B Tylar Amborn  ?04/24/21  ?  ?

## 2021-04-24 NOTE — Progress Notes (Signed)
Patient's daughter expressed concerns over patient having "multiple pressure ulcers." Patient's skin assessed with Willette Cluster, NT. No evidence of pressure ulcers noted. Coccyx and sacrum reddened, but blanchable. There is is some scarring present that may be from a hx of ulceration. Right heel reddened and slightly spongy, blanchable. Encouraged to elevate heels off bed and turn and reposition ?

## 2021-04-24 NOTE — TOC Progression Note (Signed)
Transition of Care (TOC) - Progression Note  ? ? ?Patient Details  ?Name: Jessica Morse ?MRN: [id number] ?Date of Birth: January 20, 1926 ? ?Transition of Care (TOC) CM/SW Contact  ?Cadarius Nevares E Cornelio Parkerson, LCSW ?Phone Number: ?04/24/2021, 12:19 PM ? ?Clinical Narrative:      CSW and Nature conservation officer met with daughter [name] at bedside with other daughter on speaker phone.  ?They have elected for comfort care and patient to start hospice services through [facility].  ?They are trying to determine if they want patient to go back to [facility] with hospice, hospice home, or to daughter's home with hospice. ?[facility] Representative Misty to have Hospice MD eval for hospice. ? ?12:20- CSW called [facility] and spoke with Butch Penny Agricultural consultant) at [employer]. Butch Penny stated they can take patient back at [facility] with hospice services.  ?Butch Penny is aware patient is needing max assist. She stated they could meet patient's care needs and would not need to come assess patient prior to DC. Butch Penny stated they have an Dealer at [facility] daily.   ?She stated the earliest patient could come back is Monday since Butch Penny as well as Adela Lank (Diretor of Nursing) would be on site Monday. ?CSW updated daughter [name] via phone. She stated they would want patient to go back to [facility] if appropriate, however she is wondering if patient needs hospice home as she feels patient has been deteriorating rapidly today. Asked MD and Media planner. ? ?TOC will continue to follow.  ? ? ? ?Expected Discharge Plan: Assisted Living ?Barriers to Discharge: Continued Medical Work up ? ?Expected Discharge Plan and Services ?Expected Discharge Plan: Assisted Living ?  ?Discharge Planning Services: CM Consult ?Post Acute Care Choice: Resumption of Svcs/PTA Provider ?Living arrangements for the past 2 months: [place] ?                ?DME Arranged: N/A ?DME Agency: NA ?  ?  ?  ?  ?  ?  ?  ?  ? ? ?Social  Determinants of Health (SDOH) Interventions ?  ? ?Readmission Risk Interventions ?   ? View : No data to display.  ?  ?  ?  ? ? ?

## 2021-04-24 NOTE — Plan of Care (Signed)
?  Problem: Clinical Measurements: ?Goal: Ability to maintain clinical measurements within normal limits will improve ?Outcome: Not Progressing ?  ?

## 2021-04-24 NOTE — Progress Notes (Signed)
?PROGRESS NOTE ? ? ? ?Jessica Morse  BPZ:[id number] DOB: [birth year] DOA: 04/22/2021 ?PCP: Cameron Ali, MD  ? ? ?Brief Narrative:  ?88 year old with history of dementia, frequent TIAs, hypertension, insomnia, type 2 diabetes, hypothyroidism, seizure disorder who was brought to the ER with worsening confusion and weakness with cough.  In the emergency room she had temperature 102.1, she was on room air.  WBC count 12.7.  UA was normal.  COVID and influenza negative.  CT scan of the chest with bilateral lower lobe infiltrates.  Blood cultures and urine cultures were drawn and patient was started on antibiotics for pneumonia.  Remains in overall poor clinical status. ? ? ?Assessment & Plan: ?  ?Suspected bilateral lower lobe aspiration pneumonia ?Acute metabolic encephalopathy in a patient with underlying advanced dementia, TIA and seizure disorder ?Failure to thrive ?Frailty and debility ?Multiple medical issues, difficult to treat medical conditions. ? ?Goal of care discussion at bedside 4/22.  Daughter Britta Mccreedy who is her healthcare power of attorney.  Patient has not eaten any meals since last 24 hours, she has remained mostly sleepy, declining to eat.  Patient also expressed at one point that she is dying.  Given patient's extensive medical issues, failure to thrive, she had stopped eating her life expectancy is very short and expected less than 2 to 3 weeks.  With patient's previous wishes and family wishes, she will be best treated with comfort care measures. ? ?Changed to comfort care and hospice level of care. ?We will provide end-of-life care. ?Patient can still take oral medications, she will continue to take thyroxine and Keppra for seizure prophylaxis. ?Morphine for discomfort, distress.  Ativan for anxiety and restlessness and seizure. ?Unrestricted visitation. ?Updated her palliative provider, recommend hospice at ALF and may need to transfer to inpatient hospice in the future. ?DNR/DNI. MOST  form completed with DNR comfort care and hospice level of care. ?Diet as tolerated with extreme aspiration precautions. ?No IV fluids, no lab draws, no feeding tube. ?RN may pronounce death if happens in the hospital. ? ? ?DVT prophylaxis:   Comfort care ? ? ?Code Status: Comfort care ?Family Communication: Daughter at the bedside ?Disposition Plan: Status is: Inpatient ?Remains inpatient appropriate because: Pending hospice arrangements at ALF. ?  ? ? ?Consultants:  ?None ? ?Procedures:  ?None ? ?Antimicrobials:  ?Rocephin and azithromycin 4/21-4/22 ? ? ?Subjective: ?See discussion above.  Patient seen and examined.  She looks comfortable.  She wakes up to voice and she tells [place] that she is fine and goes back to sleep.  Daughter at the bedside reported that patient declined to eat since being in the hospital.  Remains afebrile.  Mostly on room air. ? ?Objective: ?Vitals:  ? 04/23/21 1528 04/23/21 2015 04/24/21 0443 04/24/21 0734  ?BP: 103/86 (!) 151/51 (!) 147/57 (!) 146/63  ?Pulse: (!) 59 73 67 62  ?Resp: 15 16 16 16   ?Temp: 98.9 ?F (37.2 ?C) 98.9 ?F (37.2 ?C) 98.1 ?F (36.7 ?C) 99.7 ?F (37.6 ?C)  ?TempSrc:  Oral Oral   ?SpO2: 96% 98% 92% 92%  ?Weight:      ?Height:      ? ? ?Intake/Output Summary (Last 24 hours) at 04/24/2021 1109 ?Last data filed at 04/24/2021 0500 ?Gross per 24 hour  ?Intake 450 ml  ?Output 300 ml  ?Net 150 ml  ? ?Filed Weights  ? 04/22/21 1755  ?Weight: 83.9 kg  ? ? ?Examination: ? ?General exam: Frail and debilitated.  Chronically sick looking.  Currently comfortable on  room air. ?Respiratory system: No added sounds.  On room air. ?Cardiovascular system: S1 & S2 heard, RRR.  ?Gastrointestinal system: Soft.  Nontender.  Bowel sound present. ?Central nervous system: Intermittently follows commands.  Extreme generalized weakness. ?She is alert on stimulation but mostly sleepy. ?When stimulated, she has tremors. ? ? ?Data Reviewed: I have personally reviewed following labs and imaging  studies ? ?CBC: ?Recent Labs  ?Lab 04/22/21 ?1741 04/23/21 ?0501  ?WBC 12.7* 9.9  ?HGB 11.8* 10.6*  ?HCT 36.2 32.7*  ?MCV 92.6 93.7  ?PLT 200 177  ? ?Basic Metabolic Panel: ?Recent Labs  ?Lab 04/22/21 ?1741 04/23/21 ?0501 04/24/21 ?0417  ?NA 134* 136 136  ?K 4.0 3.9 3.7  ?CL 98 101 103  ?CO2 27 28 25   ?GLUCOSE 244* 190* 155*  ?BUN 20 17 19   ?CREATININE 1.10* 1.03* 1.12*  ?CALCIUM 8.7* 8.2* 8.0*  ?MG  --   --  1.4*  ?PHOS  --   --  2.7  ? ?GFR: ?Estimated Creatinine Clearance: 30.8 mL/min (A) (by C-G formula based on SCr of 1.12 mg/dL (H)). ?Liver Function Tests: ?No results for input(s): AST, ALT, ALKPHOS, BILITOT, PROT, ALBUMIN in the last 168 hours. ?No results for input(s): LIPASE, AMYLASE in the last 168 hours. ?No results for input(s): AMMONIA in the last 168 hours. ?Coagulation Profile: ?No results for input(s): INR, PROTIME in the last 168 hours. ?Cardiac Enzymes: ?No results for input(s): CKTOTAL, CKMB, CKMBINDEX, TROPONINI in the last 168 hours. ?BNP (last 3 results) ?No results for input(s): PROBNP in the last 8760 hours. ?HbA1C: ?No results for input(s): HGBA1C in the last 72 hours. ?CBG: ?Recent Labs  ?Lab 04/23/21 ?0732 04/23/21 ?1146 04/23/21 ?1620 04/23/21 ?2120 04/24/21 ?2121  ?GLUCAP 159* 136* 89 186* 158*  ? ?Lipid Profile: ?No results for input(s): CHOL, HDL, LDLCALC, TRIG, CHOLHDL, LDLDIRECT in the last 72 hours. ?Thyroid Function Tests: ?No results for input(s): TSH, T4TOTAL, FREET4, T3FREE, THYROIDAB in the last 72 hours. ?Anemia Panel: ?No results for input(s): VITAMINB12, FOLATE, FERRITIN, TIBC, IRON, RETICCTPCT in the last 72 hours. ?Sepsis Labs: ?Recent Labs  ?Lab 04/22/21 ?1741 04/23/21 ?0501  ?PROCALCITON <0.10  --   ?LATICACIDVEN 1.3 1.2  ? ? ?Recent Results (from the past 240 hour(s))  ?Resp Panel by RT-PCR (Flu A&B, Covid) Nasopharyngeal Swab     Status: None  ? Collection Time: 04/22/21  5:41 PM  ? Specimen: Nasopharyngeal Swab; Nasopharyngeal(NP) swabs in vial transport medium   ?Result Value Ref Range Status  ? SARS Coronavirus 2 by RT PCR NEGATIVE NEGATIVE Final  ?  Comment: (NOTE) ?SARS-CoV-2 target nucleic acids are NOT DETECTED. ? ?The SARS-CoV-2 RNA is generally detectable in upper respiratory ?specimens during the acute phase of infection. The lowest ?concentration of SARS-CoV-2 viral copies this assay can detect is ?138 copies/mL. A negative result does not preclude SARS-Cov-2 ?infection and should not be used as the sole basis for treatment or ?other patient management decisions. A negative result may occur with  ?improper specimen collection/handling, submission of specimen other ?than nasopharyngeal swab, presence of viral mutation(s) within the ?areas targeted by this assay, and inadequate number of viral ?copies(<138 copies/mL). A negative result must be combined with ?clinical observations, patient history, and epidemiological ?information. The expected result is Negative. ? ?Fact Sheet for Patients:  ?04/25/21 ? ?Fact Sheet for Healthcare Providers:  ?04/24/21 ? ?This test is no t yet approved or cleared by the [employer] FDA and  ?has been authorized for detection and/or diagnosis of SARS-CoV-2 by ?  FDA under an Emergency Use Authorization (EUA). This EUA will remain  ?in effect (meaning this test can be used) for the duration of the ?COVID-19 declaration under Section 564(b)(1) of the Act, 21 ?U.S.C.section 360bbb-3(b)(1), unless the authorization is terminated  ?or revoked sooner.  ? ? ?  ? Influenza A by PCR NEGATIVE NEGATIVE Final  ? Influenza B by PCR NEGATIVE NEGATIVE Final  ?  Comment: (NOTE) ?The Xpert Xpress SARS-CoV-2/FLU/RSV plus assay is intended as an aid ?in the diagnosis of influenza from Nasopharyngeal swab specimens and ?should not be used as a sole basis for treatment. Nasal washings and ?aspirates are unacceptable for Xpert Xpress SARS-CoV-2/FLU/RSV ?testing. ? ?Fact Sheet for  Patients: ?BloggerCourse.comhttps://www.fda.gov/media/152166/download ? ?Fact Sheet for Healthcare Providers: ?SeriousBroker.ithttps://www.fda.gov/media/152162/download ? ?This test is not yet approved or cleared by the [place] FDA and ?has been authorized f

## 2021-04-24 NOTE — Progress Notes (Signed)
[facility] [address] Civil engineer, contracting [facility]) Hospital Liaison RN note ? ?Received request from [facility] for hospice services at [facility] after discharge. Chart and patient information under review by Hospice physician. Hospice eligibility pending at this time.  ? ?Spoke with duaghter's Britta Mccreedy and Vernona Rieger to initiate education related to hospice philosophy, services, and team approach to care. Patient/family verbalized understanding of information given. Per discussion, the plan is for discharge home by EMS likely Sunday or Monday.  ?DME needs discussed. No DME needs prior to assessment visit in facility.  ? ?Please send signed and completed DNR with patient/family. Please provide symptoms at discharge as needed for ongoing symptom management.  ?[facility] information and contact numbers given to [place]. Above information shared with [place]. Please call with any hospice related questions or concerns.  ? ?Thank you for the opportunity to participate in this patient's care.  ?Thea Gist, Charity fundraiser, BSN ?[facility] Liaison ?[phone] ?

## 2021-04-25 DIAGNOSIS — J189 Pneumonia, unspecified organism: Secondary | ICD-10-CM | POA: Diagnosis not present

## 2021-04-25 MED ORDER — POLYVINYL ALCOHOL 1.4 % OP SOLN
2.0000 [drp] | OPHTHALMIC | Status: DC | PRN
  Administered 2021-04-25: 2 [drp] via OPHTHALMIC
  Filled 2021-04-25: qty 15

## 2021-04-25 MED ORDER — POLYETHYLENE GLYCOL 3350 17 G PO PACK
17.0000 g | PACK | Freq: Once | ORAL | Status: AC
  Administered 2021-04-25: 17 g via ORAL
  Filled 2021-04-25: qty 1

## 2021-04-25 NOTE — Progress Notes (Signed)
?PROGRESS NOTE ? ? ? ?Jessica Morse  MRN:[id number] DOB: 01/31/1926 DOA: 04/22/2021 ?PCP: Dale, Maureen Catherine, MD  ? ?Brief Narrative:  ?88-year-old with history of dementia, frequent TIAs, hypertension, insomnia, type 2 diabetes, hypothyroidism, seizure disorder who was brought to the ER with worsening confusion and weakness with cough.  In the emergency room she had temperature 102.1, she was on room air.  WBC count 12.7.  UA was normal.  COVID and influenza negative.  CT scan of the chest with bilateral lower lobe infiltrates.  Blood cultures and urine cultures were drawn and patient was started on antibiotics for pneumonia.  Remains in overall poor clinical status.  After prolonged discussion between previous provider and the family patient was transition to comfort care.  Anticipating discharge to her ALF on Monday. ? ? ?Assessment & Plan: ? Principal Problem: ?  Community acquired bilateral lower lobe pneumonia ?Active Problems: ?  Sepsis (HCC) ?  Closed fracture of proximal end of left humerus ?  Essential hypertension ?  Diabetes mellitus type 2, noninsulin dependent (HCC) ?  Anxiety and depression ?  Hypothyroidism ?  History of seizure ?  Failure to thrive in adult ?  ?Suspected bilateral lower lobe aspiration pneumonia ?Acute metabolic encephalopathy in a patient with underlying advanced dementia, TIA and seizure disorder ?Failure to thrive, adult ?Frailty and debility ?Multiple medical issues, difficult to treat medical conditions. ?Diabetes mellitus type 2, non-insulin-dependent ?Essential hypertension ?Hypothyroidism  ?  ? ?Previous provider spoke with patient's daughter Barbara, HCPOA, on 4/22, poor oral intake.  Continues to decline along with comorbid conditions.  If does not eat or drink, life expectancy is likely less than 2 weeks.  Appropriate for comfort measures. ? ?Goal of care discussion at bedside 4/22.  Daughter Barbara who is her healthcare power of attorney.  Patient has not eaten  any meals since last 24 hours, she has remained mostly sleepy, declining to eat.  Patient also expressed at one point that she is dying.  Given patient's extensive medical issues, failure to thrive, she had stopped eating her life expectancy is very short and expected less than 2 to 3 weeks.  With patient's previous wishes and family wishes, she will be best treated with comfort care measures. ?  ? ?DVT prophylaxis: Comfort care patient ?Code Status: DNR ?Family Communication: Met with the daughter at bedside ? ? ?Subjective: ?According to the daughter patient woke up a little bit this morning and had some p.o. intake.  During my visit patient was resting comfortably, I spoke with the patient's daughter at bedside extensively.  All questions answered. ? ? ? ?Objective: ?Vitals:  ? 04/23/21 2015 04/24/21 0443 04/24/21 0734 04/24/21 2059  ?BP: (!) 151/51 (!) 147/57 (!) 146/63 (!) 191/76  ?Pulse: 73 67 62 77  ?Resp: 16 16 16 16  ?Temp: 98.9 ?F (37.2 ?C) 98.1 ?F (36.7 ?C) 99.7 ?F (37.6 ?C)   ?TempSrc: Oral Oral    ?SpO2: 98% 92% 92% 94%  ?Weight:      ?Height:      ? ? ?Intake/Output Summary (Last 24 hours) at 04/25/2021 0726 ?Last data filed at 04/25/2021 0600 ?Gross per 24 hour  ?Intake --  ?Output 600 ml  ?Net -600 ml  ? ?Filed Weights  ? 04/22/21 1755  ?Weight: 83.9 kg  ? ? ? ?Data Reviewed:  ? ?CBC: ?Recent Labs  ?Lab 04/22/21 ?1741 04/23/21 ?0501  ?WBC 12.7* 9.9  ?HGB 11.8* 10.6*  ?HCT 36.2 32.7*  ?MCV 92.6 93.7  ?PLT 200   177  ? ?Basic Metabolic Panel: ?Recent Labs  ?Lab 04/22/21 ?1741 04/23/21 ?0501 04/24/21 ?0417  ?NA 134* 136 136  ?K 4.0 3.9 3.7  ?CL 98 101 103  ?CO2 27 28 25  ?GLUCOSE 244* 190* 155*  ?BUN 20 17 19  ?CREATININE 1.10* 1.03* 1.12*  ?CALCIUM 8.7* 8.2* 8.0*  ?MG  --   --  1.4*  ?PHOS  --   --  2.7  ? ?GFR: ?Estimated Creatinine Clearance: 30.8 mL/min (A) (by C-G formula based on SCr of 1.12 mg/dL (H)). ?Liver Function Tests: ?No results for input(s): AST, ALT, ALKPHOS, BILITOT, PROT, ALBUMIN in the  last 168 hours. ?No results for input(s): LIPASE, AMYLASE in the last 168 hours. ?No results for input(s): AMMONIA in the last 168 hours. ?Coagulation Profile: ?No results for input(s): INR, PROTIME in the last 168 hours. ?Cardiac Enzymes: ?No results for input(s): CKTOTAL, CKMB, CKMBINDEX, TROPONINI in the last 168 hours. ?BNP (last 3 results) ?No results for input(s): PROBNP in the last 8760 hours. ?HbA1C: ?No results for input(s): HGBA1C in the last 72 hours. ?CBG: ?Recent Labs  ?Lab 04/23/21 ?0732 04/23/21 ?1146 04/23/21 ?1620 04/23/21 ?2120 04/24/21 ?0813  ?GLUCAP 159* 136* 89 186* 158*  ? ?Lipid Profile: ?No results for input(s): CHOL, HDL, LDLCALC, TRIG, CHOLHDL, LDLDIRECT in the last 72 hours. ?Thyroid Function Tests: ?No results for input(s): TSH, T4TOTAL, FREET4, T3FREE, THYROIDAB in the last 72 hours. ?Anemia Panel: ?No results for input(s): VITAMINB12, FOLATE, FERRITIN, TIBC, IRON, RETICCTPCT in the last 72 hours. ?Sepsis Labs: ?Recent Labs  ?Lab 04/22/21 ?1741 04/23/21 ?0501  ?PROCALCITON <0.10  --   ?LATICACIDVEN 1.3 1.2  ? ? ?Recent Results (from the past 240 hour(s))  ?Resp Panel by RT-PCR (Flu A&B, Covid) Nasopharyngeal Swab     Status: None  ? Collection Time: 04/22/21  5:41 PM  ? Specimen: Nasopharyngeal Swab; Nasopharyngeal(NP) swabs in vial transport medium  ?Result Value Ref Range Status  ? SARS Coronavirus 2 by RT PCR NEGATIVE NEGATIVE Final  ?  Comment: (NOTE) ?SARS-CoV-2 target nucleic acids are NOT DETECTED. ? ?The SARS-CoV-2 RNA is generally detectable in upper respiratory ?specimens during the acute phase of infection. The lowest ?concentration of SARS-CoV-2 viral copies this assay can detect is ?138 copies/mL. A negative result does not preclude SARS-Cov-2 ?infection and should not be used as the sole basis for treatment or ?other patient management decisions. A negative result may occur with  ?improper specimen collection/handling, submission of specimen other ?than nasopharyngeal swab,  presence of viral mutation(s) within the ?areas targeted by this assay, and inadequate number of viral ?copies(<138 copies/mL). A negative result must be combined with ?clinical observations, patient history, and epidemiological ?information. The expected result is Negative. ? ?Fact Sheet for Patients:  ?https://www.fda.gov/media/152166/download ? ?Fact Sheet for Healthcare Providers:  ?https://www.fda.gov/media/152162/download ? ?This test is no t yet approved or cleared by the [place] FDA and  ?has been authorized for detection and/or diagnosis of SARS-CoV-2 by ?FDA under an Emergency Use Authorization (EUA). This EUA will remain  ?in effect (meaning this test can be used) for the duration of the ?COVID-19 declaration under Section 564(b)(1) of the Act, 21 ?U.S.C.section 360bbb-3(b)(1), unless the authorization is terminated  ?or revoked sooner.  ? ? ?  ? Influenza A by PCR NEGATIVE NEGATIVE Final  ? Influenza B by PCR NEGATIVE NEGATIVE Final  ?  Comment: (NOTE) ?The Xpert Xpress SARS-CoV-2/FLU/RSV plus assay is intended as an aid ?in the diagnosis of influenza from Nasopharyngeal swab specimens and ?  should not be used as a sole basis for treatment. Nasal washings and ?aspirates are unacceptable for Xpert Xpress SARS-CoV-2/FLU/RSV ?testing. ? ?Fact Sheet for Patients: ?https://www.fda.gov/media/152166/download ? ?Fact Sheet for Healthcare Providers: ?https://www.fda.gov/media/152162/download ? ?This test is not yet approved or cleared by the [place] FDA and ?has been authorized for detection and/or diagnosis of SARS-CoV-2 by ?FDA under an Emergency Use Authorization (EUA). This EUA will remain ?in effect (meaning this test can be used) for the duration of the ?COVID-19 declaration under Section 564(b)(1) of the Act, 21 U.S.C. ?section 360bbb-3(b)(1), unless the authorization is terminated or ?revoked. ? ?Performed at [facility], [address] [place], ?[place] [address] ?  ?Blood Culture  (routine x 2)     Status: None (Preliminary result)  ? Collection Time: 04/22/21  5:41 PM  ? Specimen: BLOOD  ?Result Value Ref Range Status  ? Specimen Description BLOOD RIGHT FOREARM  Final  ? Special

## 2021-04-26 DIAGNOSIS — Z515 Encounter for palliative care: Secondary | ICD-10-CM

## 2021-04-26 DIAGNOSIS — J189 Pneumonia, unspecified organism: Secondary | ICD-10-CM | POA: Diagnosis not present

## 2021-04-26 MED ORDER — MORPHINE SULFATE 10 MG/5ML PO SOLN
5.0000 mg | ORAL | Status: DC | PRN
  Administered 2021-04-26 (×2): 5 mg via ORAL
  Filled 2021-04-26 (×2): qty 5

## 2021-04-26 MED ORDER — MORPHINE SULFATE 10 MG/5ML PO SOLN: 5.0000 mg | ORAL | 0 refills | Status: DC | PRN

## 2021-04-26 MED ORDER — SENNOSIDES-DOCUSATE SODIUM 8.6-50 MG PO TABS
2.0000 | ORAL_TABLET | Freq: Two times a day (BID) | ORAL | Status: DC
  Administered 2021-04-26 – 2021-04-27 (×3): 2 via ORAL
  Filled 2021-04-26 (×3): qty 2

## 2021-04-26 MED ORDER — MORPHINE SULFATE (PF) 2 MG/ML IV SOLN
2.0000 mg | INTRAVENOUS | Status: DC | PRN
  Administered 2021-04-26 – 2021-04-27 (×3): 2 mg via INTRAVENOUS
  Filled 2021-04-26 (×3): qty 1

## 2021-04-26 MED ORDER — HALOPERIDOL 0.5 MG PO TABS: 0.5000 mg | ORAL_TABLET | Freq: Four times a day (QID) | ORAL | 0 refills | Status: DC | PRN

## 2021-04-26 MED ORDER — LORAZEPAM 1 MG PO TABS: 1.0000 mg | ORAL_TABLET | ORAL | 0 refills | Status: DC | PRN

## 2021-04-26 MED ORDER — LORAZEPAM 1 MG PO TABS: 1.0000 mg | ORAL_TABLET | ORAL | 0 refills | Status: AC | PRN

## 2021-04-26 MED ORDER — POLYETHYLENE GLYCOL 3350 17 G PO PACK
17.0000 g | PACK | Freq: Every day | ORAL | Status: DC
  Administered 2021-04-26 – 2021-04-27 (×2): 17 g via ORAL
  Filled 2021-04-26 (×2): qty 1

## 2021-04-26 MED ORDER — HALOPERIDOL 0.5 MG PO TABS
0.5000 mg | ORAL_TABLET | Freq: Four times a day (QID) | ORAL | 0 refills | Status: AC | PRN
Start: 2021-04-26 — End: ?

## 2021-04-26 NOTE — Discharge Summary (Addendum)
Physician Discharge Summary  ?Jaine Klingshirn [id number] DOB: 1926/03/07 DOA: 04/22/2021 ? ?PCP: Nelda Marseille, MD ? ?Admit date: 04/22/2021 ?Discharge date: 04/27/2021 ? ?Admitted From: ALF ?Disposition:  ALF with hospice ? ?Recommendations for Outpatient Follow-up:  ?Follow up with PCP in 1-2 weeks ?Please obtain BMP/CBC in one week your next doctors visit.  ? ? ?Discharge Condition: Stable ?CODE STATUS: DNR ?Diet recommendation: Diabetic ? ?Brief/Interim Summary: ?88 year old with history of dementia, frequent TIAs, hypertension, insomnia, type 2 diabetes, hypothyroidism, seizure disorder who was brought to the ER with worsening confusion and weakness with cough.  In the emergency room she had temperature 102.1, she was on room air.  WBC count 12.7.  UA was normal.  COVID and influenza negative.  CT scan of the chest with bilateral lower lobe infiltrates.  Blood cultures and urine cultures were drawn and patient was started on antibiotics for pneumonia.  Remains in overall poor clinical status.  After prolonged discussion between previous provider and the family patient was transition to comfort care.  Anticipating discharge to her ALF on Monday. ?  ?  ?Assessment & Plan: ? Principal Problem: ?  Community acquired bilateral lower lobe pneumonia ?Active Problems: ?  Sepsis ([place]) ?  Closed fracture of proximal end of left humerus ?  Essential hypertension ?  Diabetes mellitus type 2, noninsulin dependent ([place]) ?  Anxiety and depression ?  Hypothyroidism ?  History of seizure ?  Failure to thrive in adult ?  ?Suspected bilateral lower lobe aspiration pneumonia ?Acute metabolic encephalopathy in a patient with underlying advanced dementia, TIA and seizure disorder ?Failure to thrive, adult ?Frailty and debility ?Multiple medical issues, difficult to treat medical conditions. ?Diabetes mellitus type 2, non-insulin-dependent ?Essential hypertension ?Hypothyroidism  ?  ?  ?Previous provider spoke with  patient's daughter Tawni Carnes, on 4/22, poor oral intake.  Continues to decline along with comorbid conditions.  If does not eat or drink, life expectancy is likely less than 2 weeks.  Appropriate for comfort measures. ?  ?Goal of care discussion at bedside 4/22.  Daughter [name] who is her healthcare power of attorney.  Patient has not eaten any meals since last 24 hours, she has remained mostly sleepy, declining to eat.  Patient also expressed at one point that she is dying.  Given patient's extensive medical issues, failure to thrive, she had stopped eating her life expectancy is very short and expected less than 2 to 3 weeks.  With patient's previous wishes and family wishes, she will be best treated with comfort care measures. ?  ?Arrangements were made, family [name] and Cecille Rubin were updated.  Patient will be transition to ALF with hospice. ? ? ? ? ?Discharge Diagnoses:  ?Principal Problem: ?  Community acquired bilateral lower lobe pneumonia ?Active Problems: ?  Sepsis ([place]) ?  Closed fracture of proximal end of left humerus ?  Essential hypertension ?  Diabetes mellitus type 2, noninsulin dependent ([place]) ?  Anxiety and depression ?  Hypothyroidism ?  History of seizure ?  Failure to thrive in adult ?  Hospice care patient ?  Palliative care patient ? ? ? ?Subjective: ?No complaints, resting comfortably.  This morning had some hallucinations ?Family states that yesterday she had a decent day with oral appetite. ? ?Discharge Exam: ?Vitals:  ? 04/26/21 1557 04/27/21 0504  ?BP: 140/82 (!) 149/79  ?Pulse: 91 97  ?Resp: 19 17  ?Temp:  98.4 ?F (36.9 ?C)  ?SpO2: 95% 96%  ? ?Vitals:  ? 04/24/21 2059 04/25/21  [id number] 04/26/21 1557 04/27/21 0504  ?BP: (!) 191/76 (!) 153/71 140/82 (!) 149/79  ?Pulse: 77 70 91 97  ?Resp: 16 20 19 17   ?Temp:  98.8 ?F (37.1 ?C)  98.4 ?F (36.9 ?C)  ?TempSrc:    Oral  ?SpO2: 94% 94% 95% 96%  ?Weight:      ?Height:      ? ? ?General: Pt is alert, awake, not in acute distress ?Cardiovascular:  RRR, S1/S2 +, no rubs, no gallops ?Respiratory: CTA bilaterally, no wheezing, no rhonchi ?Abdominal: Soft, NT, ND, bowel sounds + ?Extremities: no edema, no cyanosis ? ?Discharge Instructions ? ? ?Allergies as of 04/27/2021   ? ?   Reactions  ? Quinolones Anaphylaxis  ? Family history of reaction  ? Codeine Other (See Comments)  ? Tried to jump out of the window  ? Clonazepam Other (See Comments)  ? Clonidine Derivatives   ? Iodinated Contrast Media   ? Propoxyphene   ? ?  ? ?  ?Medication List  ?  ? ?STOP taking these medications   ? ?cephALEXin 500 MG capsule ?Commonly known as: KEFLEX ?  ?citalopram 20 MG tablet ?Commonly known as: CELEXA ?  ?glipiZIDE 5 MG tablet ?Commonly known as: GLUCOTROL ?  ?melatonin 3 MG Tabs tablet ?  ?metFORMIN 1000 MG tablet ?Commonly known as: GLUCOPHAGE ?  ?propranolol 20 MG tablet ?Commonly known as: INDERAL ?  ?traZODone 50 MG tablet ?Commonly known as: DESYREL ?  ? ?  ? ?TAKE these medications   ? ?haloperidol 0.5 MG tablet ?Commonly known as: HALDOL ?Take 1 tablet (0.5 mg total) by mouth every 6 (six) hours as needed for agitation (or delirium). ?  ?levETIRAcetam 250 MG tablet ?Commonly known as: KEPPRA ?Take 250-500 mg by mouth 2 (two) times daily. Take 250 by mouth in the morning and 500 mg by mouth at bedtime. ?  ?levothyroxine 150 MCG tablet ?Commonly known as: SYNTHROID ?Take 150 mcg by mouth daily before breakfast. ?  ?LORazepam 1 MG tablet ?Commonly known as: ATIVAN ?Take 1 tablet (1 mg total) by mouth every 4 (four) hours as needed for anxiety. ?  ?morphine 20 MG/ML concentrated solution ?Commonly known as: ROXANOL ?Take 0.25 mLs (5 mg total) by mouth every 2 (two) hours as needed for severe pain. ?  ?polyethylene glycol 17 g packet ?Commonly known as: MIRALAX / GLYCOLAX ?Take 17 g by mouth daily. ?  ?senna-docusate 8.6-50 MG tablet ?Commonly known as: Senokot-S ?Take 2 tablets by mouth at bedtime as needed for mild constipation. ?  ? ?  ? ? Follow-up Information   ? ?  Nelda Marseille, MD Follow up in 1 week(s).   ?Specialty: Geriatric Medicine ?Contact information: ?[address] ?[id number] [facility] [address] ?[id number] ?[place] [address] ?[phone] ? ? ?  ?  ? ?  ?  ? ?  ? ?Allergies  ?Allergen Reactions  ? Quinolones Anaphylaxis  ?  Family history of reaction  ? Codeine Other (See Comments)  ?  Tried to jump out of the window  ? Clonazepam Other (See Comments)  ? Clonidine Derivatives   ? Iodinated Contrast Media   ? Propoxyphene   ? ? ?You were cared for by a hospitalist during your hospital stay. If you have any questions about your discharge medications or the care you received while you were in the hospital after you are discharged, you can call the unit and asked to speak with the hospitalist on call if the hospitalist that took  care of you is not available. Once you are discharged, your primary care physician will handle any further medical issues. Please note that no refills for any discharge medications will be authorized once you are discharged, as it is imperative that you return to your primary care physician (or establish a relationship with a primary care physician if you do not have one) for your aftercare needs so that they can reassess your need for medications and monitor your lab values. ? ? ?Procedures/Studies: ?DG Chest 2 View ? ?Result Date: 04/22/2021 ?CLINICAL DATA:  Altered mental status, weakness EXAM: CHEST - 2 VIEW COMPARISON:  None. FINDINGS: The cardiomediastinal silhouette is normal. Linear opacities in the left base on the frontal projection likely reflect subsegmental atelectasis. There is no focal consolidation or pulmonary edema. There is no pleural effusion or pneumothorax. There is a mildly displaced fracture of the proximal left humerus. There is no other acute osseous abnormality. IMPRESSION: 1. No radiographic evidence of acute cardiopulmonary process. 2. Displaced fracture of the proximal left humerus. Electronically Signed   By:  Valetta Mole M.D.   On: 04/22/2021 18:19  ? ?CT CHEST WO CONTRAST ? ?Result Date: 04/22/2021 ?CLINICAL DATA:  Altered mental status and weakness. EXAM: CT CHEST WITHOUT CONTRAST TECHNIQUE: Multidetector CT imaging

## 2021-04-26 NOTE — Progress Notes (Addendum)
[facility] [employer] [facility]) Hospital Liaison Note ? ?Lengthy discussion held at bedside with family to explain services as family had several questions. Family plans to meet with [facility] staff following discussion to clarify what care would look like for patient. Daughter/Barbara plans to reach out to MSW once her meeting concludes.  ? ?Please send signed and completed DNR with patient/family upon discharge. Please provide prescriptions at discharge as needed to ensure ongoing symptom management and a transport packet. ?  ?[facility] information and contact numbers given to family and above information shared with TOC.  ? ?Addendum: 1:45 ?Britta Mccreedy contacted MSW requesting a Hospital Bed be delivered prior to DC. Hospital IDT and ACC has been notified. ?  ?Please call with any questions/concerns.  ?  ?Thank you for the opportunity to participate in this patient's care ?  ?Odette Fraction, MSW ?[facility] Hospital Liaison  ?[phone] ?  ? ?

## 2021-04-26 NOTE — Care Management Important Message (Signed)
Important Message ? ?Patient Details  ?Name: Jessica Morse ?MRN: [id number] ?Date of Birth: 1926-05-05 ? ? ?Medicare Important Message Given:  Other (see comment) ? ?Patient is on comfort care with plan to discharge home w/Hospice. Out of respect for the patient and family no Important Message from [facility] given. ? ? ?Olegario Messier A [place] ?04/26/2021, 9:18 AM ?

## 2021-04-26 NOTE — TOC Progression Note (Signed)
Transition of Care (TOC) - Progression Note  ? ? ?Patient Details  ?Name: Jessica Morse ?MRN: [id number] ?Date of Birth: [birth year] ? ?Transition of Care (TOC) CM/SW Contact  ?Veleda Mun A Deane Melick, LCSW ?Phone Number: ?04/26/2021, 3:44 PM ? ?Clinical Narrative:   Hopsital bed will be delivered tomorrow. Pt will dc tomorrow. ALF notified. ? ? ? ?Expected Discharge Plan: Assisted Living ?Barriers to Discharge: Continued Medical Work up ? ?Expected Discharge Plan and Services ?Expected Discharge Plan: Assisted Living ?  ?Discharge Planning Services: CM Consult ?Post Acute Care Choice: Resumption of Svcs/PTA Provider ?Living arrangements for the past 2 months: Assisted Living Facility ?Expected Discharge Date: 04/26/21               ?DME Arranged: N/A ?DME Agency: NA ?  ?  ?  ?  ?  ?  ?  ?  ? ? ?Social Determinants of Health (SDOH) Interventions ?  ? ?Readmission Risk Interventions ?   ? View : No data to display.  ?  ?  ?  ? ? ?

## 2021-04-26 NOTE — NC FL2 (Addendum)
?  [place] MEDICAID FL2 LEVEL OF CARE SCREENING TOOL  ?  ? ?IDENTIFICATION  ?Patient Name: ?Jessica Morse Birthdate: [birth year] Sex: female Admission Date (Current Location): ?04/22/2021  ?[place] and [place] Number: ? [place] ?  Facility and Address:  ?[facility], [address], [place], [place] [address] ?     Provider Number: ?[id number]  ?Attending Physician Name and Address:  ?Damita Lack, MD ? Relative Name and Phone Number:  ?  ?   ?Current Level of Care: ?Hospital Recommended Level of Care: ?Assisted Living Facility Prior Approval Number: ?  ? ?Date Approved/Denied: ?  PASRR Number: ?  ? ?Discharge Plan: ? (ALF) ?  ? ?Current Diagnoses: ?Patient Active Problem List  ? Diagnosis Date Noted  ? History of seizure 04/23/2021  ? Failure to thrive in adult 04/23/2021  ? Community acquired bilateral lower lobe pneumonia   ? Dementia ([place])   ? Generalized weakness   ? Sepsis ([place]) 04/22/2021  ? Closed fracture of proximal end of left humerus 04/22/2021  ? Essential hypertension 04/22/2021  ? Diabetes mellitus type 2, noninsulin dependent ([place]) 04/22/2021  ? Anxiety and depression 04/22/2021  ? Hypothyroidism 04/22/2021  ? ? ?Orientation RESPIRATION BLADDER Height & Weight   ?  ?Self ? Normal Incontinent Weight: 185 lb (83.9 kg) ?Height:  5' (152.4 cm)  ?BEHAVIORAL SYMPTOMS/MOOD NEUROLOGICAL BOWEL NUTRITION STATUS  ?    Incontinent Diet (DYS 1 diet, nectar thick)  ?AMBULATORY STATUS COMMUNICATION OF NEEDS Skin   ?Extensive Assist Verbally Normal ?  ?  ?  ?    ?     ?     ? ? ?Personal Care Assistance Level of Assistance  ?Bathing, Feeding, Dressing Bathing Assistance: Maximum assistance ?Feeding assistance: Independent ?Dressing Assistance: Maximum assistance ?   ? ?Functional Limitations Info  ?Sight, Hearing, Speech Sight Info: Adequate ?Hearing Info: Adequate ?Speech Info: Adequate  ? ? ?SPECIAL CARE FACTORS FREQUENCY  ?    ?  ?  ?  ?  ?  ?  ?   ? ? ?Contractures Contractures  Info: Not present  ? ? ?Additional Factors Info  ?Code Status, Allergies Code Status Info: DNR ?Allergies Info: Quinolones, Codeine, Clonazepam, Clonidine Derivatives, Iodinated Contrast Media, Propoxyphene ?  ?  ?  ?   ? ? ? ? ? ?Discharge Medications: ?haloperidol 0.5 MG tablet ?Commonly known as: HALDOL ?Take 1 tablet (0.5 mg total) by mouth every 6 (six) hours as needed for agitation (or delirium). ?   ?levETIRAcetam 250 MG tablet ?Commonly known as: KEPPRA ?Take 250-500 mg by mouth 2 (two) times daily. Take 250 by mouth in the morning and 500 mg by mouth at bedtime. ?   ?levothyroxine 150 MCG tablet ?Commonly known as: SYNTHROID ?Take 150 mcg by mouth daily before breakfast. ?   ?LORazepam 1 MG tablet ?Commonly known as: ATIVAN ?Take 1 tablet (1 mg total) by mouth every 4 (four) hours as needed for anxiety. ?   ?morphine 10 MG/5ML solution ?Take 2.5 mLs (5 mg total) by mouth every 2 (two) hours as needed for moderate pain (or dyspna). ?   ?  ?   ? ? ?Relevant Imaging Results: ? ?Relevant Lab Results: ? ? ?Additional Information ?SSN:[id number] ? ?Stephenie Navejas A [place], LCSW ? ? ? ? ?

## 2021-04-27 LAB — CULTURE, BLOOD (ROUTINE X 2)
Culture: NO GROWTH
Culture: NO GROWTH
Special Requests: ADEQUATE

## 2021-04-27 LAB — LEGIONELLA PNEUMOPHILA SEROGP 1 UR AG: L. pneumophila Serogp 1 Ur Ag: NEGATIVE

## 2021-04-27 MED ORDER — POLYETHYLENE GLYCOL 3350 17 G PO PACK: 17.0000 g | PACK | Freq: Every day | ORAL | 0 refills | Status: AC

## 2021-04-27 MED ORDER — SENNOSIDES-DOCUSATE SODIUM 8.6-50 MG PO TABS: 2.0000 | ORAL_TABLET | Freq: Every evening | ORAL | 0 refills | Status: AC | PRN

## 2021-04-27 MED ORDER — MORPHINE SULFATE (CONCENTRATE) 20 MG/ML PO SOLN: 5.0000 mg | ORAL | 0 refills | Status: AC | PRN

## 2021-04-27 NOTE — Progress Notes (Signed)
Patient seen and examined at bedside this morning.  Patient's daughter is also at bedside. ?Patient is resting comfortably.  No acute events overnight. ?Discharge was postponed from yesterday as her hospice bed was not delivered. ? ?Hopefully we can discharge the patient today. ?I also spoke with patient's pharmacy to ensure they have all the prescriptions. ? ?Please call with any further questions needed ? ?Stephania Fragmin MD ?[facility] ?

## 2021-04-27 NOTE — Progress Notes (Signed)
Patient being discharged to [facility] ALF with Hospice. IV removed. All discharge paperwork and information in discharge packet. Called report no answer. Patient going to [facility] via EMS. ?

## 2021-04-27 NOTE — Progress Notes (Signed)
[facility]  ?  ?Please send signed and completed DNR with patient/family upon discharge. Please provide prescriptions at discharge as needed to ensure ongoing symptom management and a transport packet. ?  ?[facility] information and contact numbers given to family and above information shared with TOC.  ?  ?Please call with any questions/concerns.  ?  ?Thank you for the opportunity to participate in this patient's care ?  ?Odette Fraction, MSW ?[facility] Hospital Liaison  ?[phone] ?  ? ?

## 2021-06-03 DEATH — deceased
